# Patient Record
Sex: Female | Born: 1950 | Race: White | Hispanic: No | Marital: Married | Smoking: Current every day smoker
Health system: Southern US, Community
[De-identification: ages and names within clinical notes are randomized; demographics above are authoritative.]

## PROBLEM LIST (undated history)

## (undated) DIAGNOSIS — F329 Major depressive disorder, single episode, unspecified: Secondary | ICD-10-CM

## (undated) DIAGNOSIS — J309 Allergic rhinitis, unspecified: Secondary | ICD-10-CM

## (undated) DIAGNOSIS — C801 Malignant (primary) neoplasm, unspecified: Secondary | ICD-10-CM

## (undated) DIAGNOSIS — R310 Gross hematuria: Secondary | ICD-10-CM

## (undated) DIAGNOSIS — G35 Multiple sclerosis: Secondary | ICD-10-CM

## (undated) DIAGNOSIS — I700 Atherosclerosis of aorta: Secondary | ICD-10-CM

## (undated) DIAGNOSIS — K589 Irritable bowel syndrome without diarrhea: Secondary | ICD-10-CM

## (undated) DIAGNOSIS — D696 Thrombocytopenia, unspecified: Secondary | ICD-10-CM

## (undated) DIAGNOSIS — D692 Other nonthrombocytopenic purpura: Secondary | ICD-10-CM

## (undated) DIAGNOSIS — F419 Anxiety disorder, unspecified: Secondary | ICD-10-CM

## (undated) DIAGNOSIS — K219 Gastro-esophageal reflux disease without esophagitis: Secondary | ICD-10-CM

## (undated) DIAGNOSIS — Z87442 Personal history of urinary calculi: Secondary | ICD-10-CM

## (undated) DIAGNOSIS — D039 Melanoma in situ, unspecified: Secondary | ICD-10-CM

## (undated) DIAGNOSIS — D6862 Lupus anticoagulant syndrome: Secondary | ICD-10-CM

## (undated) DIAGNOSIS — I2510 Atherosclerotic heart disease of native coronary artery without angina pectoris: Secondary | ICD-10-CM

## (undated) DIAGNOSIS — E538 Deficiency of other specified B group vitamins: Secondary | ICD-10-CM

## (undated) DIAGNOSIS — M48061 Spinal stenosis, lumbar region without neurogenic claudication: Secondary | ICD-10-CM

## (undated) DIAGNOSIS — R760 Raised antibody titer: Secondary | ICD-10-CM

## (undated) DIAGNOSIS — M1711 Unilateral primary osteoarthritis, right knee: Secondary | ICD-10-CM

## (undated) DIAGNOSIS — I10 Essential (primary) hypertension: Secondary | ICD-10-CM

## (undated) DIAGNOSIS — M519 Unspecified thoracic, thoracolumbar and lumbosacral intervertebral disc disorder: Secondary | ICD-10-CM

## (undated) DIAGNOSIS — I639 Cerebral infarction, unspecified: Secondary | ICD-10-CM

## (undated) DIAGNOSIS — M5090 Cervical disc disorder, unspecified, unspecified cervical region: Secondary | ICD-10-CM

## (undated) DIAGNOSIS — F32A Depression, unspecified: Secondary | ICD-10-CM

## (undated) DIAGNOSIS — K257 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: Secondary | ICD-10-CM

## (undated) DIAGNOSIS — J410 Simple chronic bronchitis: Secondary | ICD-10-CM

## (undated) DIAGNOSIS — R519 Headache, unspecified: Secondary | ICD-10-CM

## (undated) DIAGNOSIS — J449 Chronic obstructive pulmonary disease, unspecified: Secondary | ICD-10-CM

## (undated) DIAGNOSIS — E559 Vitamin D deficiency, unspecified: Secondary | ICD-10-CM

## (undated) DIAGNOSIS — M1990 Unspecified osteoarthritis, unspecified site: Secondary | ICD-10-CM

## (undated) HISTORY — DX: Anxiety disorder, unspecified: F41.9

## (undated) HISTORY — DX: Cerebral infarction, unspecified: I63.9

## (undated) HISTORY — PX: FRACTURE SURGERY: SHX138

## (undated) HISTORY — PX: BREAST SURGERY: SHX581

## (undated) HISTORY — PX: TONSILLECTOMY: SUR1361

## (undated) HISTORY — PX: OTHER SURGICAL HISTORY: SHX169

## (undated) HISTORY — DX: Malignant (primary) neoplasm, unspecified: C80.1

## (undated) HISTORY — DX: Essential (primary) hypertension: I10

## (undated) HISTORY — PX: ABDOMINAL HYSTERECTOMY: SHX81

---

## 1988-04-07 HISTORY — PX: BREAST EXCISIONAL BIOPSY: SUR124

## 1988-04-07 HISTORY — PX: POLYPECTOMY: SHX149

## 1995-04-08 HISTORY — PX: BACK SURGERY: SHX140

## 1997-11-21 ENCOUNTER — Inpatient Hospital Stay (HOSPITAL_COMMUNITY): Admission: RE | Admit: 1997-11-21 | Discharge: 1997-11-22 | Payer: Self-pay | Admitting: Neurosurgery

## 2001-07-29 ENCOUNTER — Encounter: Payer: Self-pay | Admitting: Neurosurgery

## 2001-07-29 ENCOUNTER — Ambulatory Visit (HOSPITAL_COMMUNITY): Admission: RE | Admit: 2001-07-29 | Discharge: 2001-07-29 | Payer: Self-pay | Admitting: Neurosurgery

## 2001-09-03 ENCOUNTER — Ambulatory Visit (HOSPITAL_COMMUNITY): Admission: RE | Admit: 2001-09-03 | Discharge: 2001-09-03 | Payer: Self-pay | Admitting: Neurology

## 2001-09-03 ENCOUNTER — Encounter: Payer: Self-pay | Admitting: Neurology

## 2004-03-18 ENCOUNTER — Ambulatory Visit: Payer: Self-pay | Admitting: Unknown Physician Specialty

## 2004-07-02 ENCOUNTER — Ambulatory Visit: Payer: Self-pay | Admitting: Unknown Physician Specialty

## 2005-05-21 ENCOUNTER — Ambulatory Visit: Payer: Self-pay | Admitting: Unknown Physician Specialty

## 2005-07-22 ENCOUNTER — Ambulatory Visit: Payer: Self-pay | Admitting: Unknown Physician Specialty

## 2005-10-16 ENCOUNTER — Ambulatory Visit: Payer: Self-pay | Admitting: Gastroenterology

## 2006-07-29 ENCOUNTER — Ambulatory Visit: Payer: Self-pay | Admitting: Unknown Physician Specialty

## 2007-08-25 ENCOUNTER — Ambulatory Visit: Payer: Self-pay | Admitting: Unknown Physician Specialty

## 2008-08-25 ENCOUNTER — Ambulatory Visit: Payer: Self-pay | Admitting: Unknown Physician Specialty

## 2009-07-19 ENCOUNTER — Ambulatory Visit: Payer: Self-pay | Admitting: Unknown Physician Specialty

## 2009-07-31 ENCOUNTER — Ambulatory Visit: Payer: Self-pay | Admitting: Unknown Physician Specialty

## 2009-08-07 ENCOUNTER — Ambulatory Visit: Payer: Self-pay | Admitting: Unknown Physician Specialty

## 2009-10-11 ENCOUNTER — Ambulatory Visit: Payer: Self-pay | Admitting: Unknown Physician Specialty

## 2010-12-10 ENCOUNTER — Ambulatory Visit: Payer: Self-pay | Admitting: Unknown Physician Specialty

## 2011-05-07 ENCOUNTER — Ambulatory Visit: Payer: Self-pay | Admitting: Gastroenterology

## 2011-12-11 ENCOUNTER — Ambulatory Visit: Payer: Self-pay | Admitting: Internal Medicine

## 2012-03-26 ENCOUNTER — Ambulatory Visit: Payer: Self-pay | Admitting: Oncology

## 2012-04-12 ENCOUNTER — Ambulatory Visit: Payer: Self-pay | Admitting: Internal Medicine

## 2012-04-12 LAB — CBC CANCER CENTER
Basophil #: 0 x10 3/mm (ref 0.0–0.1)
Basophil %: 0.7 %
Eosinophil #: 0.1 x10 3/mm (ref 0.0–0.7)
Eosinophil %: 1.9 %
HCT: 36.6 % (ref 35.0–47.0)
HGB: 12.3 g/dL (ref 12.0–16.0)
Lymphocyte #: 1.1 x10 3/mm (ref 1.0–3.6)
Lymphocyte %: 19.8 %
MCH: 31.1 pg (ref 26.0–34.0)
MCHC: 33.6 g/dL (ref 32.0–36.0)
MCV: 92 fL (ref 80–100)
Monocyte #: 0.3 x10 3/mm (ref 0.2–0.9)
Monocyte %: 5.7 %
Neutrophil #: 3.9 x10 3/mm (ref 1.4–6.5)
Neutrophil %: 71.9 %
Platelet: 131 x10 3/mm — ABNORMAL LOW (ref 150–440)
RBC: 3.96 10*6/uL (ref 3.80–5.20)
RDW: 16.1 % — ABNORMAL HIGH (ref 11.5–14.5)
WBC: 5.5 x10 3/mm (ref 3.6–11.0)

## 2012-04-12 LAB — IRON AND TIBC
Iron Bind.Cap.(Total): 373 ug/dL (ref 250–450)
Iron Saturation: 20 %
Iron: 75 ug/dL (ref 50–170)
Unbound Iron-Bind.Cap.: 298 ug/dL

## 2012-04-12 LAB — LACTATE DEHYDROGENASE: LDH: 234 U/L (ref 81–246)

## 2012-04-12 LAB — FOLATE: Folic Acid: 35.8 ng/mL (ref 3.1–100.0)

## 2012-04-12 LAB — FERRITIN: Ferritin (ARMC): 72 ng/mL (ref 8–388)

## 2012-05-08 ENCOUNTER — Ambulatory Visit: Payer: Self-pay | Admitting: Internal Medicine

## 2012-06-30 ENCOUNTER — Encounter: Payer: Self-pay | Admitting: Internal Medicine

## 2012-07-06 ENCOUNTER — Encounter: Payer: Self-pay | Admitting: Internal Medicine

## 2012-07-09 ENCOUNTER — Ambulatory Visit: Payer: Self-pay | Admitting: Oncology

## 2012-07-29 ENCOUNTER — Ambulatory Visit (HOSPITAL_COMMUNITY): Admission: RE | Admit: 2012-07-29 | Payer: Medicare Other | Source: Home / Self Care | Admitting: Psychiatry

## 2012-08-05 ENCOUNTER — Encounter: Payer: Self-pay | Admitting: Internal Medicine

## 2012-08-25 ENCOUNTER — Encounter: Payer: Self-pay | Admitting: Orthopedic Surgery

## 2012-12-13 ENCOUNTER — Ambulatory Visit: Payer: Self-pay | Admitting: Internal Medicine

## 2013-10-03 ENCOUNTER — Observation Stay: Payer: Self-pay | Admitting: Internal Medicine

## 2013-10-03 ENCOUNTER — Ambulatory Visit: Payer: Self-pay | Admitting: Neurology

## 2013-10-03 LAB — URINALYSIS, COMPLETE
BLOOD: NEGATIVE
Bilirubin,UR: NEGATIVE
Glucose,UR: NEGATIVE mg/dL (ref 0–75)
Ketone: NEGATIVE
LEUKOCYTE ESTERASE: NEGATIVE
Nitrite: NEGATIVE
Ph: 6 (ref 4.5–8.0)
Protein: NEGATIVE
RBC,UR: 1 /HPF (ref 0–5)
SPECIFIC GRAVITY: 1.006 (ref 1.003–1.030)
Squamous Epithelial: 2
WBC UR: 2 /HPF (ref 0–5)

## 2013-10-03 LAB — BASIC METABOLIC PANEL
Anion Gap: 3 — ABNORMAL LOW (ref 7–16)
BUN: 22 mg/dL — ABNORMAL HIGH (ref 7–18)
Calcium, Total: 8.9 mg/dL (ref 8.5–10.1)
Chloride: 107 mmol/L (ref 98–107)
Co2: 27 mmol/L (ref 21–32)
Creatinine: 0.83 mg/dL (ref 0.60–1.30)
EGFR (African American): >60
EGFR (Non-African Amer.): >60
Glucose: 83 mg/dL (ref 65–99)
Osmolality: 276 (ref 275–301)
Potassium: 3.8 mmol/L (ref 3.5–5.1)
Sodium: 137 mmol/L (ref 136–145)

## 2013-10-03 LAB — CBC
HCT: 36.3 % (ref 35.0–47.0)
HGB: 12.1 g/dL (ref 12.0–16.0)
MCH: 30.1 pg (ref 26.0–34.0)
MCHC: 33.4 g/dL (ref 32.0–36.0)
MCV: 90 fL (ref 80–100)
Platelet: 131 10*3/uL — ABNORMAL LOW (ref 150–440)
RBC: 4.03 10*6/uL (ref 3.80–5.20)
RDW: 16.6 % — ABNORMAL HIGH (ref 11.5–14.5)
WBC: 4.8 10*3/uL (ref 3.6–11.0)

## 2013-10-03 LAB — CK TOTAL AND CKMB (NOT AT ARMC)
CK, TOTAL: 49 U/L
CK-MB: 1.1 ng/mL (ref 0.5–3.6)

## 2013-10-03 LAB — TROPONIN I: Troponin-I: <0.02 ng/mL

## 2013-10-04 LAB — LIPID PANEL
Cholesterol: 163 mg/dL (ref 0–200)
HDL Cholesterol: 35 mg/dL — ABNORMAL LOW (ref 40–60)
LDL CHOLESTEROL, CALC: 78 mg/dL (ref 0–100)
TRIGLYCERIDES: 248 mg/dL — AB (ref 0–200)
VLDL Cholesterol, Calc: 50 mg/dL — ABNORMAL HIGH (ref 5–40)

## 2013-10-04 LAB — BASIC METABOLIC PANEL
Anion Gap: 5 — ABNORMAL LOW (ref 7–16)
BUN: 19 mg/dL — ABNORMAL HIGH (ref 7–18)
CO2: 26 mmol/L (ref 21–32)
Calcium, Total: 9 mg/dL (ref 8.5–10.1)
Chloride: 109 mmol/L — ABNORMAL HIGH (ref 98–107)
Creatinine: 0.77 mg/dL (ref 0.60–1.30)
EGFR (Non-African Amer.): >60
GLUCOSE: 77 mg/dL (ref 65–99)
Osmolality: 280 (ref 275–301)
Potassium: 3.7 mmol/L (ref 3.5–5.1)
Sodium: 140 mmol/L (ref 136–145)

## 2013-10-04 LAB — MAGNESIUM: Magnesium: 2.1 mg/dL

## 2013-12-14 ENCOUNTER — Ambulatory Visit: Payer: Self-pay | Admitting: Internal Medicine

## 2014-07-29 NOTE — Discharge Summary (Signed)
PATIENT NAME:  Angela Suarez, Angela Suarez MR#:  [id number] DATE OF BIRTH:  1950-06-10  DATE OF ADMISSION:  10/03/2013 DATE OF DISCHARGE:  10/04/2013  FINAL DIAGNOSES: 1.  Double vision, blurred vision, ataxia, possibly secondary to vertigo.  2.  Hypertension.  3.  History of cerebrovascular accident.  4.  History of multiple sclerosis.   HISTORY AND PHYSICAL: Please see dictated admission history and physical.   HOSPITAL COURSE: The patient was admitted with onset of blurred vision and ataxia, with some vertigo symptoms. Worse when she was lying down and turning her head. She also has had significant social stressors and she thought this might have contributed as well. Her blood pressure was noted to be elevated, however, when she initially came in with her symptoms her blood pressure was normal, so blood pressure did not appear to be related to it. Neurology saw the patient who felt this was likely related to stress events versus vertigo. Her symptoms resolved completely within 3 hours, they did not recommend further work-up. She is ambulating in the room on the morning of discharge and wants to go home. At this point, she will be discharged home in stable condition with physical activity to be up as tolerated. She will follow up in our office in 1 to 2 weeks. She will follow a low sodium diet.   DISCHARGE MEDICATIONS: 1.  Clonazepam 1 mg tablets, 3 p.o. at bedtime, 0.5 mg p.o. at noon.  2.  Lyrica 100 mg p.o. daily.  3.  Sertraline 100 mg p.o. daily.  4.  Lipitor 10 mg p.o. at bedtime.  5.  Rebif subcutaneous 3 times a week.  6.  Amlodipine 2.5 mg p.o. daily.  7.  Aspirin 81 mg p.o. daily.  8.  Meclizine 25 mg p.o. q. 4 hours as needed for dizziness or vertigo; potential risk for sedation was discussed.   ____________________________ Adin Hector, MD bjk:sb D: 10/04/2013 07:45:06 ET T: 10/04/2013 07:56:08 ET JOB#: [id number]  cc: Tama High III, MD, <Dictator> Ramonita Lab MD ELECTRONICALLY  SIGNED 10/10/2013 8:05

## 2014-07-29 NOTE — H&P (Signed)
PATIENT NAME:  Angela Suarez, Angela Suarez MR#:  [id number] DATE OF BIRTH:  03-06-1951  DATE OF ADMISSION:  10/03/2013  PRIMARY CARE PHYSICIAN: Dr. Gorden Harms.   REFERRING PHYSICIAN: Dr. Reita Cliche.   CHIEF COMPLAINT: Double vision, blurred vision, and ataxia today.   HISTORY OF PRESENT ILLNESS: A 64 year old Caucasian female with a history of multiple sclerosis for 12 years, CVA last year, presents in the ED with blurred vision and double vision today. The patient is alert, awake, oriented, in no acute distress. The patient said that she started to have double vision and blurred vision early this morning. In addition, the patient feels imbalance and weakness which are not her baseline. The patient's blood pressure was above 130 at home but increased to 170s in ED. The patient denies any numbness and tingling. No headache but has dizziness. The patient denies any dysphagia, slurred speech, or incontinence. Denies any other symptoms.   PAST MEDICAL HISTORY: Multiple sclerosis, CVA, and hypertension.   PAST SURGICAL HISTORY: Hysterectomy, spine fusion, breast biopsy, and throat polyp removal.   SOCIAL HISTORY: Smokes 15 cigarettes a day for 50 years. Denies any alcohol drinking or illicit drugs.   FAMILY HISTORY: No hypertension, diabetes, heart attack, or stroke.   ALLERGIES: CODEINE, FLAGYL, OXYCODONE, PREVACID.   HOME MEDICATIONS:  1. Zoloft 100 mg p.o. daily.  2. Rebif subcutaneous 1 dose 3 times a day.  3. Lyrica 100 mg p.o. daily. 4. Lipitor 10 mg p.o. at bedtime.  5. Klonopin 1 mg 3 tablets at bedtime p.o.  6. Klonopin 0.5 mg 1 tab once a day at noon. 7. Aspirin 81 mg p.o. daily. 8. Norvasc 2.5 mg p.o. daily.   REVIEW OF SYSTEMS:  CONSTITUTIONAL: The patient denies any fever or chills, but has dizziness and weakness. No headache.  EYES: Positive for double vision, blurred vision.  HEENT: No dysphagia, slurred speech. No epistaxis.  CARDIOVASCULAR: No chest pain, palpitation, orthopnea, or  nocturnal dyspnea. No leg edema.  PULMONARY: No cough, sputum, shortness of breath, or hemoptysis.  GASTROINTESTINAL: No abdominal pain, nausea, vomiting, diarrhea. No melena or bloody stool.  GENITOURINARY: No dysuria, hematuria, or incontinence.  SKIN: No rash or jaundice.  NEUROLOGIC: No syncope, loss of consciousness, or seizure.  ENDOCRINE: No polyuria, polydipsia, heat or cold intolerance.  HEMATOLOGY: No easy bruising or bleeding.   PHYSICAL EXAMINATION:  VITAL SIGNS: Temperature 97.9, blood pressure 171/99, pulse 82, oxygen saturation 94% on room air.  GENERAL: The patient is alert, awake, oriented, in no acute distress.  HEENT:  Pupils round, equally reactive to light and accommodation.  NECK: Supple. No JVD or carotid bruits. No lymphadenopathy. No thyromegaly.  EARS, NOSE, AND THROAT: Moist oral mucosa, clear oropharynx.  CARDIOVASCULAR: S1, S2 regular rate and rhythm. No murmurs or gallops.  PULMONARY: Bilateral air entry. No wheezing or rales. No use of accessory muscle to breathe.  ABDOMEN: Soft. No distention. No tenderness. No organomegaly. Bowel sounds present.  EXTREMITIES: No edema, clubbing or cyanosis. No calf tenderness. Bilateral pedal pulses present.  SKIN: No rash or jaundice.  NEUROLOGIC: A and O x 3. No focal deficit. Power 5/5. Sensory intact.   LABORATORY DATA: Urinalysis is negative. CAT scan of head is negative. No acute findings. CBC in normal range except platelets 131,000, glucose 83, BUN 22, creatinine 0.83. Electrolytes are normal. Troponin less than 0.02.    Her EKG showed normal sinus rhythm at a 70 BPM.   IMPRESSIONS:  1. Ataxia, need to rule out a cerebrovascular accident or a  multiple sclerosis flare.  2. Dehydration.  3. Hypertension.   PLAN OF TREATMENT:  1. The patient will be placed for observation. We will get a carotid duplex. Plan is going to get an MRI of brain, echocardiograph but according to Dr. Irish Elders, patient has no acute  cerebrovascular accident, does not need an MRI. 2. We will continue aspirin, statin and get a neuro check and a physical therapy consult. Hold rebif this time due to ataxia.  3. For dehydration, we will start normal saline IV and follow up BMP.  4. For hypertension, continue Norvasc and adjust dose depending on the patient's blood pressure. Discussed the patient's condition and plan of treatment with patient.   CODE STATUS: The patient is a full code.   Discussed with Dr. Irish Elders. He suggested no MRI, no steroid. Monitor the patient today and get a physical therapy evaluation, possible discharge home tomorrow.   TIME SPENT: About 68 minutes.    ____________________________ Demetrios Loll, MD qc:lt D: 10/03/2013 15:47:21 ET T: 10/03/2013 21:06:58 ET JOB#: [id number]  cc: Demetrios Loll, MD, <Dictator> Demetrios Loll MD ELECTRONICALLY SIGNED 10/04/2013 12:23

## 2014-07-29 NOTE — Consult Note (Signed)
PATIENT NAME:  Angela Suarez, CRANMER MR#:  [id number] DATE OF BIRTH:  1950-04-27  DATE OF CONSULTATION:  10/03/2013  REFERRING PHYSICIAN:   CONSULTING PHYSICIAN:  Leotis Pain, MD  REASON FOR CONSULTATION: Questionable stroke.   HISTORY OF PRESENT ILLNESS: This is a 64 year old pleasant female with past medical history of multiple sclerosis that was diagnosed in 2003 via imaging. At that time she had a complaint of right-sided numbness that included facial and arm numbness for about 6 months. Based on imaging, it was suspected this was multiple sclerosis. The patient was started on Rebif which she is currently on 3 times a week. No recent relapse in remissions, but in the past has 1 relapse in remission at which point she has required steroids. In March of 2015, the patient had left facial numbness and left hand numbness that was worked up and the patient was found to have right thalamic stroke at which point the patient was started on 81 mg. Today presented with dizziness, diplopia and generalized weakness. Symptoms have improved throughout the course of the stay. CAT scan of the head did not show any acute intracranial pathology.   PAST MEDICAL HISTORY: As described above of multiple sclerosis and a stroke in March of 2014  SOCIAL HISTORY: Positive smoking history. No EtOH. No drug use.   FAMILY HISTORY: Positive hypertension.   DIAGNOSTIC DATA: Imaging: CAT scan of the head did not show any acute pathology.   Laboratory workup just significant for slight elevation of BUN, suspicion for dehydration.   REVIEW OF SYSTEMS: No headache. No fever. No chills. No generalized weakness. No shortness of breath. No chest pain. No abdominal pain. No cough. No hemoptysis. No urinary incontinence. No weakness on one side of the body or the other that is new. No anxiety. No depression.   PHYSICAL EXAMINATION: VITAL SIGNS: Include a pulse of 82, blood pressure elevated at 171/99, and pulse ox 94% on room air.   NEUROLOGIC: The patient is alert, awake and oriented to date, time and location. Able to tell me how many quarters are in 1.50. Able to tell me days of the week backwards. Speech appears to be fluent. No dysarthria. No aphasia is present. Cranial nerve examination: Extraocular movements intact. Pupils reactive bilaterally. Facial sensation intact. Facial motor intact. Tongue is midline. Uvula elevates symmetrically. Shoulder shrug intact. Motor strength appears to be 5/5 in bilateral upper and lower extremities. No sensory deficits noted. Reflexes slightly hyperreflexive, likely from history of multiple sclerosis. Coordination: Finger-to-nose intact. Gait not assessed.   IMPRESSION: A 64 year old female presenting with vague symptoms of dizziness, diplopia and generalized weakness, which have improved, with elevated blood pressure on admission. Suspect symptoms could be related from elevated blood pressure.   PLAN: Would not do any further imaging. MRI of the brain was canceled. Continue the aspirin 81 mg daily. The patient is scheduled for MRI in November for which she follows up with [facility] neurology. Would observe for overnight hydration, physical therapy and likely discharge planning.   Thank you. It was a pleasure seeing this patient. This case was discussed with Dr. Bridgett Larsson at bedside.   ____________________________ Leotis Pain, MD yz:sb D: 10/03/2013 15:54:01 ET T: 10/03/2013 16:24:33 ET JOB#: [id number]  cc: Leotis Pain, MD, <Dictator> Leotis Pain MD ELECTRONICALLY SIGNED 10/18/2013 14:54

## 2014-08-14 ENCOUNTER — Other Ambulatory Visit: Payer: Self-pay | Admitting: Internal Medicine

## 2014-08-14 DIAGNOSIS — Z1231 Encounter for screening mammogram for malignant neoplasm of breast: Secondary | ICD-10-CM

## 2014-09-20 ENCOUNTER — Inpatient Hospital Stay: Payer: Medicare Other | Attending: Family Medicine | Admitting: Family Medicine

## 2014-09-20 ENCOUNTER — Ambulatory Visit
Admission: RE | Admit: 2014-09-20 | Discharge: 2014-09-20 | Disposition: A | Discharge status: Home / Self Care | Payer: Medicare Other | Source: Ambulatory Visit | Attending: Family Medicine | Admitting: Family Medicine

## 2014-09-20 ENCOUNTER — Other Ambulatory Visit: Payer: Self-pay | Admitting: Family Medicine

## 2014-09-20 DIAGNOSIS — Z720 Tobacco use: Secondary | ICD-10-CM

## 2014-09-20 DIAGNOSIS — F17210 Nicotine dependence, cigarettes, uncomplicated: Secondary | ICD-10-CM | POA: Diagnosis not present

## 2014-09-20 DIAGNOSIS — Z122 Encounter for screening for malignant neoplasm of respiratory organs: Secondary | ICD-10-CM

## 2014-09-20 NOTE — Progress Notes (Signed)
In accordance with CMS guidelines, patient has meet eligibility criteria including age, absence of signs or symptoms of lung cancer, the specific calculation of cigarette smoking pack-years was 50 years and is a current smoker.   A shared decision-making session was conducted prior to the performance of CT scan. This includes one or more decision aids, includes benefits and harms of screening, follow-up diagnostic testing, over-diagnosis, false positive rate, and total radiation exposure.  Counseling on the importance of adherence to annual lung cancer LDCT screening, impact of co-morbidities, and ability or willingness to undergo diagnosis and treatment is imperative for compliance of the program.  Counseling on the importance of continued smoking cessation for former smokers; the importance of smoking cessation for current smokers and information about tobacco cessation interventions have been given to patient including the [place] [facility], 1800 quit [place], as well as Cancer Center specific smoking cessation programs.  Written order for lung cancer screening with LDCT has been given to the patient and any and all questions have been answered to the best of my abilities.   Yearly follow up will be scheduled by Shawn Perkins, Thoracic Navigator.   

## 2014-09-20 NOTE — Progress Notes (Deleted)
In accordance with CMS guidelines, patient has meet eligibility criteria including age, absence of signs or symptoms of lung cancer, the specific calculation of cigarette smoking pack-years was 40 years and she is a former smoker, having quit 5 years ago.   A shared decision-making session was conducted prior to the performance of CT scan. This includes one or more decision aids, includes benefits and harms of screening, follow-up diagnostic testing, over-diagnosis, false positive rate, and total radiation exposure.  Counseling on the importance of adherence to annual lung cancer LDCT screening, impact of co-morbidities, and ability or willingness to undergo diagnosis and treatment is imperative for compliance of the program.  Counseling on the importance of continued smoking cessation for former smokers; the importance of smoking cessation for current smokers and information about tobacco cessation interventions have been given to patient including the [place] [facility], 1800 quit [place], as well as [place] specific smoking cessation programs.  Written order for lung cancer screening with LDCT has been given to the patient and any and all questions have been answered to the best of my abilities.   Yearly follow up will be scheduled by Burgess Estelle, Thoracic Navigator.

## 2014-09-22 ENCOUNTER — Telehealth: Payer: Self-pay | Admitting: *Deleted

## 2014-09-22 NOTE — Telephone Encounter (Signed)
Notified patient of LDCT lung cancer screening results of Lung Rads  2  finding with recommendation for 12 month follow up imaging. Also notified of incidental finding of mild emphysema and coronary artery atherosclerosis. Encouraged to consider the smoking cessation programs that we have available.

## 2014-12-18 ENCOUNTER — Other Ambulatory Visit: Payer: Self-pay | Admitting: Internal Medicine

## 2014-12-18 ENCOUNTER — Ambulatory Visit
Admission: RE | Admit: 2014-12-18 | Discharge: 2014-12-18 | Disposition: A | Discharge status: Home / Self Care | Payer: Medicare Other | Source: Ambulatory Visit | Attending: Internal Medicine | Admitting: Internal Medicine

## 2014-12-18 DIAGNOSIS — Z1231 Encounter for screening mammogram for malignant neoplasm of breast: Secondary | ICD-10-CM | POA: Diagnosis present

## 2015-10-12 ENCOUNTER — Telehealth: Payer: Self-pay | Admitting: *Deleted

## 2015-10-12 NOTE — Telephone Encounter (Signed)
Left voicemail for patient notifyng them that it is time to schedule annual low dose lung cancer screening CT scan. Instructed patient to call back to verify information prior to the scan being scheduled. In past patient had refused to have follow up scan but PCP office contacted me with request to contact patient again.

## 2015-10-16 ENCOUNTER — Telehealth: Payer: Self-pay | Admitting: *Deleted

## 2015-10-16 NOTE — Telephone Encounter (Signed)
Notified patient that annual lung cancer screening low dose CT scan is due. Confirmed that patient is within the age range of 55-77, and asymptomatic, (no signs or symptoms of lung cancer). Patient denies illness that would prevent curative treatment for lung cancer if found. The patient is a current smoker, with a 51 pack year history. The shared decision making visit was done 09/20/14. Patient is agreeable for CT scan being scheduled.

## 2015-10-26 ENCOUNTER — Other Ambulatory Visit: Payer: Self-pay | Admitting: Internal Medicine

## 2015-10-26 DIAGNOSIS — Z1231 Encounter for screening mammogram for malignant neoplasm of breast: Secondary | ICD-10-CM

## 2015-10-31 ENCOUNTER — Other Ambulatory Visit: Payer: Self-pay | Admitting: *Deleted

## 2015-10-31 DIAGNOSIS — Z87891 Personal history of nicotine dependence: Secondary | ICD-10-CM

## 2015-11-07 ENCOUNTER — Ambulatory Visit
Admission: RE | Admit: 2015-11-07 | Discharge: 2015-11-07 | Disposition: A | Discharge status: Home / Self Care | Payer: Medicare HMO | Source: Ambulatory Visit | Attending: Oncology | Admitting: Oncology

## 2015-11-07 DIAGNOSIS — Z87891 Personal history of nicotine dependence: Secondary | ICD-10-CM | POA: Diagnosis present

## 2015-11-07 DIAGNOSIS — J439 Emphysema, unspecified: Secondary | ICD-10-CM | POA: Insufficient documentation

## 2015-11-07 DIAGNOSIS — I2510 Atherosclerotic heart disease of native coronary artery without angina pectoris: Secondary | ICD-10-CM | POA: Insufficient documentation

## 2015-11-09 ENCOUNTER — Telehealth: Payer: Self-pay | Admitting: *Deleted

## 2015-11-09 NOTE — Telephone Encounter (Signed)
Notified patient of LDCT lung cancer screening results with recommendation for 12 month follow up imaging. Also notified of incidental finding noted below. Patient verbalizes understanding.   IMPRESSION: Lung-RADS Category 2, benign appearance or behavior. Continue annual screening with low-dose chest CT without contrast in 12 months.  Emphysema.  Coronary artery atherosclerosis

## 2015-12-03 ENCOUNTER — Ambulatory Visit: Payer: Medicare HMO | Attending: Psychiatry | Admitting: Physical Therapy

## 2015-12-03 ENCOUNTER — Encounter: Payer: Self-pay | Admitting: Physical Therapy

## 2015-12-03 DIAGNOSIS — M6281 Muscle weakness (generalized): Secondary | ICD-10-CM | POA: Diagnosis present

## 2015-12-03 DIAGNOSIS — R262 Difficulty in walking, not elsewhere classified: Secondary | ICD-10-CM | POA: Insufficient documentation

## 2015-12-03 NOTE — Therapy (Signed)
[facility] [address] [place], [address] Phone: [phone]   Fax:  [phone]  Physical Therapy Evaluation  Patient Details  Name: Angela Suarez MRN: [id number] Date of Birth: 25-Apr-1950 Referring Provider: Janit Pagan MD  Encounter Date: 12/03/2015      PT End of Session - 12/03/15 1631    Visit Number 1   Number of Visits 12   Date for PT Re-Evaluation 01/14/16   Authorization Type 1   Authorization Time Period 10 (G code)   PT Start Time 1522   PT Stop Time 1638   PT Time Calculation (min) 76 min   Activity Tolerance Patient tolerated treatment well   Behavior During Therapy [facility] for tasks assessed/performed      Past Medical History:  Diagnosis Date  . Anxiety   . Cancer ([place])    skin  . Hypertension   . Stroke [facility])    06/2012    Past Surgical History:  Procedure Laterality Date  . BREAST BIOPSY Right 1990   x 2, neg    There were no vitals filed for this visit.       Subjective Assessment - 12/03/15 1558    Subjective Patient reports she is having more difficulty with gait, balance, that has been worsening over the past year due to weakness and progressive disease, MS. Both hips hurt and feels weakness limits her abiltiy to walk without losing balance.    Pertinent History Diagnosed with MS 14 years ago and has had injections for treatment for exacerbating remitting type disease until July 2017. She has tried YOGA with good results in the past. 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    Limitations Walking;Standing;House hold activities;Other (comment)  community ambulation long distances   How long can you sit comfortably? as long as she likes with choice of seating   How long can you stand comfortably? 15 min.   How long can you walk comfortably? with walker: 30 min.   Patient Stated  Goals To be able to exercise with yoga and positions to avoid,    Currently in Pain? Yes   Pain Score 3   pain ranges from 1/10 up to 8/10   Pain Location Hip   Pain Orientation Right;Left   Pain Descriptors / Indicators Aching   Pain Type Chronic pain   Pain Onset More than a month ago   Pain Frequency Constant   Aggravating Factors  too much walking or sitting or standing   Pain Relieving Factors rest   Effect of Pain on Daily Activities difficulty with walking and standing activties            [facility] PT Assessment - 12/03/15 1550      Assessment   Medical Diagnosis Multiple Sclerosis (SMS-HCC) (G35)   Referring Provider Janit Pagan MD   Onset Date/Surgical Date 11/05/13   Hand Dominance Right   Next MD Visit unknown   Prior Therapy none     Precautions   Precautions Fall   Precaution Comments decreased balance, confidence     Balance Screen   Has the patient fallen in the past 6 months Yes   How many times? 4  1 in the garage, 3 in her home: no apparent reason   Has the patient had a decrease in activity level because of a fear of  falling?  No   Is the patient reluctant to leave their home because of a fear of falling?  No     Home Environment   Living Environment Private residence   Living Arrangements Spouse/significant other   Type of [place] to enter   Entrance Stairs-Number of Steps 3   Entrance Stairs-Rails Can reach both   [place] One level   [place] bars - toilet;Cane - single point;Toilet riser  rollator     Prior Function   Level of Independence Independent   Vocation Retired   Leisure read, shop, visit with friends, socialize, farm living     Cognition   Overall Cognitive Status Within Functional Limits for tasks assessed     Objective: Gait: steppage gait with left LE, decreased weight shift to left LE Strength: decreased left hip abduction, ER 3+/5 grossly assessed, as compared to right LE, both  LE ankles DF decreased as per patient report Sensation: per patient report: paresthesias both LE's, left hand and numbness both feet  Outcome measures: LEFS 36/80, TUG 11.8 seconds (WFL for community ambulation), 5x sit<>stand 14.8 seconds (in need of intervention)  Treatment: Therapeutic exercise: patient performed exercises with guidance, verbal and tactile cues and demonstration of PT: Sitting:  Hip adduction with glute sets with ball x 10 reps 3 second holds Hip abduction in sit with resistive band x 15 reps Side lying clamshells x 10 reps each LE Supine lying: Bridging with ball between knees x 10 reps with TrA contraction and guidance for correct technique (note: shaking, unstable during exercise) Sit to stand with blue airex pad under right foot to encourage left LE weight bearing x 5 reps  Patient response to treatment:  Improved ability to walk with decreased steppage gait pattern, improved weight shift to left with exercises to encourage weight shifting. Good understanding of exercises with assistance and moderate verbal cuing and demonstration. Technique improved with practice/repetition for bridging.        PT Education - 12/03/15 1645    Education provided Yes   Education Details HEP; siting hip adduction with ball and glute sets, hip abduction with resistive band, side lying clam   Person(s) Educated Patient   Methods Explanation;Demonstration;Handout;Verbal cues   Comprehension Verbalized understanding;Verbal cues required;Returned demonstration            PT Long Term Goals - 12/03/15 1644      PT LONG TERM GOAL #1   Title Patient will demonstrate improved balance, function with daily tasks with LEFS score of 50/80 by 01/14/2016   Baseline LEFS 36/80   Status New     PT LONG TERM GOAL #2   Title Patient will demonstrate decreased fall risk allowing improved balance, walking in home and community with TUG of 10 seconds or less by 01/14/2016   Baseline TUG  11.8 seconds   Status New     PT LONG TERM GOAL #3   Title patient will demonstrate imrpoved strength with 5x sit to stand in 10 seconds or less by 01/14/2016   Baseline 5x sit to stand 14.8 seconds   Status New     PT LONG TERM GOAL #4   Title patient will be independent with home program without cuing for progressive exercises for balance and strengthening by 01/14/2016 to allow self management following discharge from physical therapy   Baseline requires assistance, guidance for all exercises; limited knowledge of appropriate exercises for strengthening   Status New  Plan - 12/03/15 1643    Clinical Impression Statement Patient is a 65 year old female who presents with difficulty walking with decreased confidence for balance when walking. She has weakness in LE's left>right LE expecially in hips. She feels unstable with walking on any surface and has limited knowledge of appopriate exercises to perform to improve strength to decrease fall risk and improve function.    Rehab Potential Fair   Clinical Impairments Affecting Rehab Potential (+) motivated, family support (-) age, chronic condition MS, progression of weakness   PT Frequency 2x / week   PT Duration 6 weeks   PT Treatment/Interventions Therapeutic exercise;Balance training;Neuromuscular re-education;Patient/family education   PT Next Visit Plan balance system, progressive exercise, strengthening   PT Home Exercise Plan hip adduction with ball, hip abduction with resistive band in sitting,    Consulted and Agree with Plan of Care Patient      Patient will benefit from skilled therapeutic intervention in order to improve the following deficits and impairments:  Decreased strength, Decreased balance, Pain, Decreased activity tolerance, Decreased endurance, Difficulty walking  Visit Diagnosis: Muscle weakness (generalized) - Plan: PT plan of care cert/re-cert  Difficulty in walking, not elsewhere  classified - Plan: PT plan of care cert/re-cert      G-Codes - [id number]    Functional Assessment Tool Used LEFS, 5x sit to stand, TUG, strength deficits, pain, clinical judgment   Functional Limitation Mobility: Walking and moving around   Mobility: Walking and Moving Around Current Status [id number]) At least 40 percent but less than 60 percent impaired, limited or restricted   Mobility: Walking and Moving Around Goal Status [id number]) At least 20 percent but less than 40 percent impaired, limited or restricted       Problem List There are no active problems to display for this patient.   Jomarie Longs PT 12/04/2015, 4:44 PM  [facility] [address] [place], [address] Phone: [phone]   Fax:  [phone]  Name: Angela Suarez MRN: [id number] Date of Birth: 1951/03/10

## 2015-12-14 ENCOUNTER — Ambulatory Visit: Payer: Medicare HMO | Admitting: Physical Therapy

## 2015-12-18 ENCOUNTER — Encounter: Payer: Medicare HMO | Admitting: Physical Therapy

## 2015-12-19 ENCOUNTER — Other Ambulatory Visit: Payer: Self-pay | Admitting: Internal Medicine

## 2015-12-19 ENCOUNTER — Ambulatory Visit
Admission: RE | Admit: 2015-12-19 | Discharge: 2015-12-19 | Disposition: A | Discharge status: Home / Self Care | Payer: Medicare HMO | Source: Ambulatory Visit | Attending: Internal Medicine | Admitting: Internal Medicine

## 2015-12-19 DIAGNOSIS — Z1231 Encounter for screening mammogram for malignant neoplasm of breast: Secondary | ICD-10-CM

## 2015-12-20 ENCOUNTER — Encounter: Payer: Medicare HMO | Admitting: Physical Therapy

## 2015-12-21 ENCOUNTER — Encounter: Payer: Self-pay | Admitting: Emergency Medicine

## 2015-12-21 ENCOUNTER — Emergency Department
Admission: EM | Admit: 2015-12-21 | Discharge: 2015-12-22 | Disposition: A | Discharge status: Home / Self Care | Payer: Medicare HMO | Attending: Emergency Medicine | Admitting: Emergency Medicine

## 2015-12-21 ENCOUNTER — Emergency Department: Payer: Medicare HMO

## 2015-12-21 DIAGNOSIS — Z85828 Personal history of other malignant neoplasm of skin: Secondary | ICD-10-CM | POA: Insufficient documentation

## 2015-12-21 DIAGNOSIS — I10 Essential (primary) hypertension: Secondary | ICD-10-CM | POA: Diagnosis not present

## 2015-12-21 DIAGNOSIS — Z79899 Other long term (current) drug therapy: Secondary | ICD-10-CM | POA: Diagnosis not present

## 2015-12-21 DIAGNOSIS — R072 Precordial pain: Secondary | ICD-10-CM | POA: Diagnosis present

## 2015-12-21 DIAGNOSIS — F17200 Nicotine dependence, unspecified, uncomplicated: Secondary | ICD-10-CM | POA: Diagnosis not present

## 2015-12-21 DIAGNOSIS — R079 Chest pain, unspecified: Secondary | ICD-10-CM

## 2015-12-21 DIAGNOSIS — R0789 Other chest pain: Secondary | ICD-10-CM | POA: Diagnosis not present

## 2015-12-21 DIAGNOSIS — Z7982 Long term (current) use of aspirin: Secondary | ICD-10-CM | POA: Insufficient documentation

## 2015-12-21 LAB — TROPONIN I

## 2015-12-21 LAB — CBC
HCT: 41.6 % (ref 35.0–47.0)
HEMOGLOBIN: 14 g/dL (ref 12.0–16.0)
MCH: 30.6 pg (ref 26.0–34.0)
MCHC: 33.8 g/dL (ref 32.0–36.0)
MCV: 90.7 fL (ref 80.0–100.0)
Platelets: 128 10*3/uL — ABNORMAL LOW (ref 150–440)
RBC: 4.58 MIL/uL (ref 3.80–5.20)
RDW: 16.8 % — ABNORMAL HIGH (ref 11.5–14.5)
WBC: 7.5 10*3/uL (ref 3.6–11.0)

## 2015-12-21 LAB — BASIC METABOLIC PANEL
ANION GAP: 8 (ref 5–15)
BUN: 30 mg/dL — ABNORMAL HIGH (ref 6–20)
CALCIUM: 9 mg/dL (ref 8.9–10.3)
CO2: 29 mmol/L (ref 22–32)
Chloride: 100 mmol/L — ABNORMAL LOW (ref 101–111)
Creatinine, Ser: 0.92 mg/dL (ref 0.44–1.00)
Glucose, Bld: 115 mg/dL — ABNORMAL HIGH (ref 65–99)
Potassium: 3 mmol/L — ABNORMAL LOW (ref 3.5–5.1)
Sodium: 137 mmol/L (ref 135–145)

## 2015-12-21 MED ORDER — MORPHINE SULFATE (PF) 4 MG/ML IV SOLN
4.0000 mg | Freq: Once | INTRAVENOUS | Status: AC
  Administered 2015-12-21: 4 mg via INTRAVENOUS
  Filled 2015-12-21: qty 1

## 2015-12-21 MED ORDER — IOPAMIDOL (ISOVUE-370) INJECTION 76%
75.0000 mL | Freq: Once | INTRAVENOUS | Status: AC | PRN
  Administered 2015-12-21: 75 mL via INTRAVENOUS

## 2015-12-21 MED ORDER — POTASSIUM CHLORIDE CRYS ER 20 MEQ PO TBCR
40.0000 meq | EXTENDED_RELEASE_TABLET | Freq: Once | ORAL | Status: AC
  Administered 2015-12-21: 40 meq via ORAL
  Filled 2015-12-21: qty 2

## 2015-12-21 MED ORDER — ASPIRIN 81 MG PO CHEW
324.0000 mg | CHEWABLE_TABLET | Freq: Once | ORAL | Status: AC
  Administered 2015-12-21: 324 mg via ORAL
  Filled 2015-12-21: qty 4

## 2015-12-21 MED ORDER — SODIUM CHLORIDE 0.9 % IV BOLUS (SEPSIS)
1000.0000 mL | Freq: Once | INTRAVENOUS | Status: AC
  Administered 2015-12-21: 1000 mL via INTRAVENOUS
  Filled 2015-12-21: qty 1000

## 2015-12-21 NOTE — ED Notes (Signed)
MD at bedside. 

## 2015-12-21 NOTE — Discharge Instructions (Addendum)

## 2015-12-21 NOTE — ED Triage Notes (Signed)
Pt presents with chest pain that radiates up into her neck and into her back, started last night. Pt had ems come to her home last night to check her out and all vss.  Pt states she called her PCP today and they could see her until Monday so she came here for eval.

## 2015-12-21 NOTE — ED Provider Notes (Signed)
[facility] Emergency Department Provider Note  ____________________________________________  Time seen: Approximately 9:32 PM  I have reviewed the triage vital signs and the nursing notes.   HISTORY  Chief Complaint Chest Pain   HPI Angela Suarez is a 65 y.o. female with history of multiple sclerosis, stroke, hypertension, and anxiety who presents for evaluation of chest pain. Patient reports the chest pain started suddenly yesterday 7 PM he has been constant, dull pain located substernally, radiating up her neck and to her upper back between her shoulder blades. The pain is pleuritic in nature. She reports that the pain is currently 8/10. She denies shortness of breath, fever, cough, abdominal pain, nausea, vomiting, dysuria, hematuria. She is a smoker. She denies personal or family history of ischemic heart disease, blood clots, recent travel or immobilization, exogenous hormones, hemoptysis, leg pain or swelling. Patient has had 1 stress test many years ago which per her was negative.  Past Medical History:  Diagnosis Date  . Anxiety   . Cancer ([place])    skin  . Hypertension   . Stroke [facility])    06/2012    There are no active problems to display for this patient.   Past Surgical History:  Procedure Laterality Date  . BREAST BIOPSY Right 1990   x 2, neg    Prior to Admission medications   Medication Sig Start Date End Date Taking? Authorizing Provider  aspirin 325 MG tablet Take 325 mg by mouth daily.    Historical Provider, MD  atorvastatin (LIPITOR) 10 MG tablet Take 10 mg by mouth daily.    Historical Provider, MD  clonazePAM (KLONOPIN) 1 MG tablet Take 1 mg by mouth 2 (two) times daily.    Historical Provider, MD  hydrochlorothiazide (HYDRODIURIL) 25 MG tablet Take 25 mg by mouth daily.    Historical Provider, MD  losartan (COZAAR) 25 MG tablet Take 25 mg by mouth daily.    Historical Provider, MD  pregabalin (LYRICA) 150 MG capsule Take 150  mg by mouth 2 (two) times daily.    Historical Provider, MD    Allergies Codeine; Flagyl [metronidazole]; Prevacid [lansoprazole]; and Vicodin [hydrocodone-acetaminophen]  Family History  Problem Relation Age of Onset  . Breast cancer Mother 21  . Cancer Mother   . Breast cancer Paternal Aunt 20  . Breast cancer Paternal Aunt 35  . Cancer Father     Social History Social History  Substance Use Topics  . Smoking status: Current Every Day Smoker  . Smokeless tobacco: Never Used  . Alcohol use Not on file    Review of Systems  Constitutional: Negative for fever. Eyes: Negative for visual changes. ENT: Negative for sore throat. Cardiovascular: + chest pain. Respiratory: Negative for shortness of breath. Gastrointestinal: Negative for abdominal pain, vomiting or diarrhea. Genitourinary: Negative for dysuria. Musculoskeletal: Negative for back pain. Skin: Negative for rash. Neurological: Negative for headaches, weakness or numbness.  ____________________________________________   PHYSICAL EXAM:  VITAL SIGNS: ED Triage Vitals  Enc Vitals Group     BP 12/21/15 1840 (!) 129/92     Pulse Rate 12/21/15 1840 91     Resp 12/21/15 1840 20     Temp 12/21/15 1840 98.4 F (36.9 C)     Temp Source 12/21/15 1840 Oral     SpO2 12/21/15 1840 95 %     Weight 12/21/15 1841 152 lb (68.9 kg)     Height 12/21/15 1841 5\' 4"  (1.626 m)     Head Circumference --  Peak Flow --      Pain Score 12/21/15 1841 9     Pain Loc --      Pain Edu? --      Excl. in [place]? --     Constitutional: Alert and oriented. Well appearing and in no apparent distress. HEENT:      Head: Normocephalic and atraumatic.         Eyes: Conjunctivae are normal. Sclera is non-icteric. EOMI. PERRL      Mouth/Throat: Mucous membranes are moist.       Neck: Supple with no signs of meningismus. Cardiovascular: Regular rate and rhythm. No murmurs, gallops, or rubs. 2+ symmetrical distal pulses are present in all  extremities. No JVD. Respiratory: Normal respiratory effort. Lungs are clear to auscultation bilaterally. No wheezes, crackles, or rhonchi.  Gastrointestinal: Soft, non tender, and non distended with positive bowel sounds. No rebound or guarding. Genitourinary: No CVA tenderness. Musculoskeletal: Nontender with normal range of motion in all extremities. No edema, cyanosis, or erythema of extremities. Neurologic: Normal speech and language. Face is symmetric. Moving all extremities. No gross focal neurologic deficits are appreciated. Skin: Skin is warm, dry and intact. No rash noted. Psychiatric: Mood and affect are normal. Speech and behavior are normal.  ____________________________________________   LABS (all labs ordered are listed, but only abnormal results are displayed)  Labs Reviewed  BASIC METABOLIC PANEL - Abnormal; Notable for the following:       Result Value   Potassium 3.0 (*)    Chloride 100 (*)    Glucose, Bld 115 (*)    BUN 30 (*)    All other components within normal limits  CBC - Abnormal; Notable for the following:    RDW 16.8 (*)    Platelets 128 (*)    All other components within normal limits  TROPONIN I  LIPASE, BLOOD  HEPATIC FUNCTION PANEL  TROPONIN I   ____________________________________________  EKG  ED ECG REPORT I, Rudene Re, the attending physician, personally viewed and interpreted this ECG.  Normal sinus rhythm, rate of 91, normal intervals, left axis deviation, no ST elevations or depressions. ____________________________________________  RADIOLOGY  CXR: Negative  CTA chest: PND ____________________________________________   PROCEDURES  Procedure(s) performed: None Procedures Critical Care performed:  None ____________________________________________   INITIAL IMPRESSION / ASSESSMENT AND PLAN / ED COURSE  65 y.o. female with history of multiple sclerosis, stroke, hypertension, and anxiety who presents for evaluation  of constant substernal chest pain since yesterday 7 PM. Pain is pleuritic with no other associated symptoms. On exam she is well appearing, no distress, her vital signs are within normal limits, physical exam is unremarkable. EKG is nonischemic. First troponin is negative. We'll watch patient on telemetry and gave her full dose of aspirin although history is not consistent with ACS and patient has a HEART score of 1. We'll get a CTA to rule out PE. We'll also check lipase and LFTs to rule out an abdominal pathology however patient has no tenderness to palpation on her abdomen. We'll treat her symptoms with IV fluids and IV morphine.  Clinical Course   _________________________ 10:51 PM on 12/21/2015 -----------------------------------------  CTA, 2nd troponin, LFT and lipase pending. Care transferred to Dr. Marcelene Butte.  Pertinent labs & imaging results that were available during my care of the patient were reviewed by me and considered in my medical decision making (see chart for details).    ____________________________________________   FINAL CLINICAL IMPRESSION(S) / ED DIAGNOSES  Final diagnoses:  Chest  pain, unspecified chest pain type      NEW MEDICATIONS STARTED DURING THIS VISIT:  New Prescriptions   No medications on file     Note:  This document was prepared using Dragon voice recognition software and may include unintentional dictation errors.    Rudene Re, MD 12/21/15 2252

## 2015-12-21 NOTE — ED Notes (Signed)
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  She says her [facility] records will tell her story, as she goes there a lot and they have treated her in the past.

## 2015-12-22 LAB — HEPATIC FUNCTION PANEL
ALBUMIN: 3.5 g/dL (ref 3.5–5.0)
ALT: 14 U/L (ref 14–54)
AST: 19 U/L (ref 15–41)
Alkaline Phosphatase: 53 U/L (ref 38–126)
BILIRUBIN TOTAL: 0.5 mg/dL (ref 0.3–1.2)
Bilirubin, Direct: <0.1 mg/dL — ABNORMAL LOW (ref 0.1–0.5)
Total Protein: 6.6 g/dL (ref 6.5–8.1)

## 2015-12-22 LAB — TROPONIN I

## 2015-12-22 LAB — LIPASE, BLOOD: LIPASE: 24 U/L (ref 11–51)

## 2015-12-22 MED ORDER — LIDOCAINE 5 % EX PTCH: 1.0000 | MEDICATED_PATCH | CUTANEOUS | 0 refills | Status: DC

## 2015-12-22 NOTE — ED Provider Notes (Signed)
-----------------------------------------   12:44 AM on 12/22/2015 -----------------------------------------   Blood pressure 139/85, pulse 87, temperature 98.4 F (36.9 C), temperature source Oral, resp. rate (!) 21, height 5\' 4"  (1.626 m), weight 152 lb (68.9 kg), SpO2 91 %.  Assuming care from Dr. Alfred Levins.  In short, Angela Suarez is a 65 y.o. female with a chief complaint of Chest Pain .  Refer to the original H&P for additional details.  The current plan of care is to follow patient's laboratory work which included a repeat troponin, LFTs, and lipase which were all within normal limits. Patient's received an extensive evaluation for her pleuritic chest pain and does not appear to have a life-threatening etiology at this time and will be discharged home.    Daymon Larsen, MD 12/22/15 [phone]

## 2015-12-26 ENCOUNTER — Encounter: Payer: Medicare HMO | Admitting: Physical Therapy

## 2015-12-28 ENCOUNTER — Encounter: Payer: Medicare HMO | Admitting: Physical Therapy

## 2015-12-31 ENCOUNTER — Encounter: Payer: Medicare HMO | Admitting: Physical Therapy

## 2016-01-03 ENCOUNTER — Encounter: Payer: Medicare HMO | Admitting: Physical Therapy

## 2016-01-07 ENCOUNTER — Encounter: Payer: Medicare HMO | Admitting: Physical Therapy

## 2016-01-10 ENCOUNTER — Encounter: Payer: Self-pay | Admitting: Physical Therapy

## 2016-06-27 ENCOUNTER — Other Ambulatory Visit: Payer: Self-pay | Admitting: Gastroenterology

## 2016-06-27 DIAGNOSIS — R1310 Dysphagia, unspecified: Secondary | ICD-10-CM

## 2016-07-08 ENCOUNTER — Ambulatory Visit
Admission: RE | Admit: 2016-07-08 | Discharge: 2016-07-08 | Disposition: A | Discharge status: Home / Self Care | Payer: Medicare HMO | Source: Ambulatory Visit | Attending: Gastroenterology | Admitting: Gastroenterology

## 2016-07-08 DIAGNOSIS — R1310 Dysphagia, unspecified: Secondary | ICD-10-CM | POA: Diagnosis not present

## 2016-07-08 DIAGNOSIS — K222 Esophageal obstruction: Secondary | ICD-10-CM | POA: Insufficient documentation

## 2016-07-08 DIAGNOSIS — K449 Diaphragmatic hernia without obstruction or gangrene: Secondary | ICD-10-CM | POA: Diagnosis not present

## 2016-09-10 ENCOUNTER — Other Ambulatory Visit: Payer: Self-pay | Admitting: Physical Medicine and Rehabilitation

## 2016-09-10 DIAGNOSIS — M5416 Radiculopathy, lumbar region: Secondary | ICD-10-CM

## 2016-09-15 ENCOUNTER — Ambulatory Visit: Payer: Medicare HMO | Admitting: Anesthesiology

## 2016-09-15 ENCOUNTER — Encounter
Admission: RE | Disposition: A | Discharge status: Home / Self Care | Payer: Self-pay | Source: Ambulatory Visit | Attending: Unknown Physician Specialty

## 2016-09-15 ENCOUNTER — Encounter: Payer: Self-pay | Admitting: *Deleted

## 2016-09-15 ENCOUNTER — Ambulatory Visit
Admission: RE | Admit: 2016-09-15 | Discharge: 2016-09-15 | Disposition: A | Discharge status: Home / Self Care | Payer: Medicare HMO | Source: Ambulatory Visit | Attending: Unknown Physician Specialty | Admitting: Unknown Physician Specialty

## 2016-09-15 DIAGNOSIS — G35 Multiple sclerosis: Secondary | ICD-10-CM | POA: Insufficient documentation

## 2016-09-15 DIAGNOSIS — J449 Chronic obstructive pulmonary disease, unspecified: Secondary | ICD-10-CM | POA: Diagnosis not present

## 2016-09-15 DIAGNOSIS — Z7982 Long term (current) use of aspirin: Secondary | ICD-10-CM | POA: Insufficient documentation

## 2016-09-15 DIAGNOSIS — I10 Essential (primary) hypertension: Secondary | ICD-10-CM | POA: Insufficient documentation

## 2016-09-15 DIAGNOSIS — K219 Gastro-esophageal reflux disease without esophagitis: Secondary | ICD-10-CM | POA: Insufficient documentation

## 2016-09-15 DIAGNOSIS — K5730 Diverticulosis of large intestine without perforation or abscess without bleeding: Secondary | ICD-10-CM | POA: Diagnosis not present

## 2016-09-15 DIAGNOSIS — F419 Anxiety disorder, unspecified: Secondary | ICD-10-CM | POA: Insufficient documentation

## 2016-09-15 DIAGNOSIS — R200 Anesthesia of skin: Secondary | ICD-10-CM | POA: Insufficient documentation

## 2016-09-15 DIAGNOSIS — F17210 Nicotine dependence, cigarettes, uncomplicated: Secondary | ICD-10-CM | POA: Insufficient documentation

## 2016-09-15 DIAGNOSIS — M1990 Unspecified osteoarthritis, unspecified site: Secondary | ICD-10-CM | POA: Diagnosis not present

## 2016-09-15 DIAGNOSIS — I69398 Other sequelae of cerebral infarction: Secondary | ICD-10-CM | POA: Diagnosis not present

## 2016-09-15 DIAGNOSIS — Z8673 Personal history of transient ischemic attack (TIA), and cerebral infarction without residual deficits: Secondary | ICD-10-CM | POA: Diagnosis not present

## 2016-09-15 DIAGNOSIS — K589 Irritable bowel syndrome without diarrhea: Secondary | ICD-10-CM | POA: Diagnosis not present

## 2016-09-15 DIAGNOSIS — K648 Other hemorrhoids: Secondary | ICD-10-CM | POA: Insufficient documentation

## 2016-09-15 DIAGNOSIS — Z8371 Family history of colonic polyps: Secondary | ICD-10-CM | POA: Insufficient documentation

## 2016-09-15 DIAGNOSIS — F329 Major depressive disorder, single episode, unspecified: Secondary | ICD-10-CM | POA: Insufficient documentation

## 2016-09-15 DIAGNOSIS — Z1211 Encounter for screening for malignant neoplasm of colon: Secondary | ICD-10-CM | POA: Diagnosis not present

## 2016-09-15 HISTORY — PX: COLONOSCOPY WITH PROPOFOL: SHX5780

## 2016-09-15 HISTORY — DX: Major depressive disorder, single episode, unspecified: F32.9

## 2016-09-15 HISTORY — DX: Unspecified osteoarthritis, unspecified site: M19.90

## 2016-09-15 HISTORY — DX: Irritable bowel syndrome, unspecified: K58.9

## 2016-09-15 HISTORY — DX: Multiple sclerosis: G35

## 2016-09-15 HISTORY — DX: Gastro-esophageal reflux disease without esophagitis: K21.9

## 2016-09-15 HISTORY — DX: Depression, unspecified: F32.A

## 2016-09-15 HISTORY — DX: Raised antibody titer: R76.0

## 2016-09-15 HISTORY — DX: Allergic rhinitis, unspecified: J30.9

## 2016-09-15 HISTORY — DX: Chronic obstructive pulmonary disease, unspecified: J44.9

## 2016-09-15 HISTORY — DX: Cervical disc disorder, unspecified, unspecified cervical region: M50.90

## 2016-09-15 HISTORY — DX: Unspecified thoracic, thoracolumbar and lumbosacral intervertebral disc disorder: M51.9

## 2016-09-15 SURGERY — COLONOSCOPY WITH PROPOFOL
Anesthesia: General

## 2016-09-15 MED ORDER — PROPOFOL 10 MG/ML IV BOLUS
INTRAVENOUS | Status: DC | PRN
  Administered 2016-09-15: 50 mg via INTRAVENOUS

## 2016-09-15 MED ORDER — SODIUM CHLORIDE 0.9 % IV SOLN
INTRAVENOUS | Status: DC
  Administered 2016-09-15: 12:00:00 via INTRAVENOUS

## 2016-09-15 MED ORDER — SODIUM CHLORIDE 0.9 % IV SOLN: INTRAVENOUS | Status: DC

## 2016-09-15 MED ORDER — PROPOFOL 10 MG/ML IV BOLUS
INTRAVENOUS | Status: AC
  Filled 2016-09-15: qty 20

## 2016-09-15 MED ORDER — PROPOFOL 500 MG/50ML IV EMUL
INTRAVENOUS | Status: DC | PRN
  Administered 2016-09-15: 150 ug/kg/min via INTRAVENOUS

## 2016-09-15 NOTE — H&P (Signed)
Primary Care Physician:  Adin Hector, MD Primary Gastroenterologist:  Dr. Vira Agar  Pre-Procedure History & Physical: HPI:  Angela Suarez is a 66 y.o. female is here for an colonoscopy.   Past Medical History:  Diagnosis Date  . Allergic rhinitis   . Anxiety   . Arthritis   . Cancer ([place])    skin  . Cervical disc disease   . COPD (chronic obstructive pulmonary disease) ([place])   . Depression   . GERD (gastroesophageal reflux disease)   . Hypertension   . IBS (irritable bowel syndrome)   . Lumbar disc disease   . Lupus anticoagulant positive   . Multiple sclerosis ([place])   . Stroke ([place])    06/2012  . Thoracic disc disease     Past Surgical History:  Procedure Laterality Date  . ABDOMINAL HYSTERECTOMY    . BREAST BIOPSY Right 1990   x 2, neg  . colon polyps    . colonoscipy    . POLYPECTOMY Right 1990   throat polyp removed by Dr Ladene Artist.   . TONSILLECTOMY      Prior to Admission medications   Medication Sig Start Date End Date Taking? Authorizing Provider  aspirin 325 MG tablet Take 325 mg by mouth daily.   Yes [provider]  atorvastatin (LIPITOR) 10 MG tablet Take 10 mg by mouth daily.   Yes [provider]  clonazePAM (KLONOPIN) 1 MG tablet Take 1 mg by mouth 3 (three) times daily as needed.    Yes [provider]  hydrochlorothiazide (HYDRODIURIL) 25 MG tablet Take 25 mg by mouth daily.   Yes [provider]  Interferon Beta-1a (REBIF REBIDOSE) 44 MCG/0.5ML SOAJ Inject into the skin 3 (three) times a week.   Yes [provider]  losartan (COZAAR) 25 MG tablet Take 25 mg by mouth daily.   Yes [provider]  pregabalin (LYRICA) 150 MG capsule Take 150 mg by mouth 2 (two) times daily.   Yes [provider]  sertraline (ZOLOFT) 100 MG tablet Take 100 mg by mouth daily.   Yes [provider]  Albuterol Sulfate 108 (90 Base) MCG/ACT AEPB Inhale into the lungs every 6 (six) hours.     [provider]  lidocaine (LIDODERM) 5 % Place 1 patch onto the skin daily. Patient not taking: Reported on 09/15/2016 12/22/15   Daymon Larsen, MD    Allergies as of 06/30/2016 - Review Complete 12/21/2015  Allergen Reaction Noted  . Codeine  12/03/2015  . Flagyl [metronidazole]  12/03/2015  . Prevacid [lansoprazole]  12/03/2015  . Vicodin [hydrocodone-acetaminophen]  12/03/2015    Family History  Problem Relation Age of Onset  . Breast cancer Mother 78  . Cancer Mother   . Breast cancer Paternal Aunt 17  . Breast cancer Paternal Aunt 68  . Cancer Father     Social History   Social History  . Marital status: Married    Spouse name: N/A  . Number of children: N/A  . Years of education: N/A   Occupational History  . Not on file.   Social History Main Topics  . Smoking status: Current Every Day Smoker  . Smokeless tobacco: Never Used  . Alcohol use No  . Drug use: No  . Sexual activity: Not on file   Other Topics Concern  . Not on file   Social History Narrative  . No narrative on file    Review of Systems: See HPI, otherwise  negative ROS  Physical Exam: BP 130/76   Pulse 82   Temp 97 F (36.1 C)   Resp 16   Ht 5\' 4"  (1.626 m)   Wt 69.4 kg (153 lb)   SpO2 100%   BMI 26.26 kg/m  General:   Alert,  pleasant and cooperative in NAD Head:  Normocephalic and atraumatic. Neck:  Supple; no masses or thyromegaly. Lungs:  Clear throughout to auscultation.    Heart:  Regular rate and rhythm. Abdomen:  Soft, nontender and nondistended. Normal bowel sounds, without guarding, and without rebound.   Neurologic:  Alert and  oriented x4;  grossly normal neurologically.  Impression/Plan: Angela Suarez is here for an colonoscopy to be performed for FH colon polyps  Risks, benefits, limitations, and alternatives regarding  colonoscopy have been reviewed with the patient.  Questions have been answered.  All parties agreeable.   Gaylyn Cheers, MD   09/15/2016, 12:46 PM

## 2016-09-15 NOTE — Anesthesia Post-op Follow-up Note (Cosign Needed)
Anesthesia QCDR form completed.        

## 2016-09-15 NOTE — Anesthesia Preprocedure Evaluation (Signed)
Anesthesia Evaluation  Patient identified by MRN, date of birth, ID band Patient awake    Reviewed: Allergy & Precautions, NPO status , Patient's Chart, lab work & pertinent test results  History of Anesthesia Complications Negative for: history of anesthetic complications  Airway Mallampati: I  TM Distance: >3 FB Neck ROM: Full    Dental no notable dental hx.    Pulmonary COPD, Current Smoker,    breath sounds clear to auscultation- rhonchi (-) wheezing      Cardiovascular hypertension, Pt. on medications (-) CAD, (-) Past MI and (-) Cardiac Stents  Rhythm:Regular Rate:Normal - Systolic murmurs and - Diastolic murmurs    Neuro/Psych PSYCHIATRIC DISORDERS Anxiety Depression Multiple sclerosis  CVA (residual L face and L hand numbness), Residual Symptoms    GI/Hepatic Neg liver ROS, GERD  ,  Endo/Other  negative endocrine ROSneg diabetes  Renal/GU negative Renal ROS     Musculoskeletal  (+) Arthritis ,   Abdominal (+) - obese,   Peds  Hematology negative hematology ROS (+)   Anesthesia Other Findings Past Medical History: No date: Allergic rhinitis No date: Anxiety No date: Arthritis No date: Cancer ([place])     Comment: skin No date: Cervical disc disease No date: COPD (chronic obstructive pulmonary disease) (* No date: Depression No date: GERD (gastroesophageal reflux disease) No date: Hypertension No date: IBS (irritable bowel syndrome) No date: Lumbar disc disease No date: Lupus anticoagulant positive No date: Multiple sclerosis ([place]) No date: Stroke [facility])     Comment: 06/2012 No date: Thoracic disc disease   Reproductive/Obstetrics                             Anesthesia Physical Anesthesia Plan  ASA: III  Anesthesia Plan: General   Post-op Pain Management:    Induction: Intravenous  PONV Risk Score and Plan: 1 and Propofol  Airway Management Planned: Natural  Airway  Additional Equipment:   Intra-op Plan:   Post-operative Plan:   Informed Consent: I have reviewed the patients History and Physical, chart, labs and discussed the procedure including the risks, benefits and alternatives for the proposed anesthesia with the patient or authorized representative who has indicated his/her understanding and acceptance.   Dental advisory given  Plan Discussed with: CRNA and Anesthesiologist  Anesthesia Plan Comments:         Anesthesia Quick Evaluation

## 2016-09-15 NOTE — Op Note (Signed)
[facility] Gastroenterology Patient Name: Angela Suarez Procedure Date: 09/15/2016 12:28 PM MRN: [id number] Account #: [id number] Date of Birth: 02/16/1951 Admit Type: Outpatient Age: 66 Room: [facility] ENDO ROOM 3 Gender: Female Note Status: Finalized Procedure:            Colonoscopy Indications:          Screening for colorectal malignant neoplasm Providers:            Manya Silvas, MD Referring MD:         Ramonita Lab, MD (Referring MD) Medicines:            Propofol per Anesthesia Complications:        No immediate complications. Procedure:            Pre-Anesthesia Assessment:                       - After reviewing the risks and benefits, the patient                        was deemed in satisfactory condition to undergo the                        procedure.                       After obtaining informed consent, the colonoscope was                        passed under direct vision. Throughout the procedure,                        the patient's blood pressure, pulse, and oxygen                        saturations were monitored continuously. The                        Colonoscope was introduced through the anus and                        advanced to the the cecum, identified by appendiceal                        orifice and ileocecal valve. The colonoscopy was                        performed without difficulty. The patient tolerated the                        procedure well. The quality of the bowel preparation                        was excellent. Findings:      A few small-mouthed diverticula were found in the sigmoid colon.      Internal hemorrhoids were found during endoscopy. The hemorrhoids were       small and Grade I (internal hemorrhoids that do not prolapse).      The exam was otherwise without abnormality. Impression:           - Diverticulosis in the sigmoid colon.                       -  Internal hemorrhoids.                       - The  examination was otherwise normal.                       - No specimens collected. Recommendation:       - Repeat colonoscopy in 10 years for screening purposes. Manya Silvas, MD 09/15/2016 1:14:22 PM This report has been signed electronically. Number of Addenda: 0 Note Initiated On: 09/15/2016 12:28 PM Scope Withdrawal Time: 0 hours 11 minutes 38 seconds  Total Procedure Duration: 0 hours 19 minutes 6 seconds       [facility]

## 2016-09-15 NOTE — Anesthesia Procedure Notes (Signed)
Performed by: Shaina Gullatt Pre-anesthesia Checklist: Patient identified, Emergency Drugs available, Suction available, Patient being monitored and Timeout performed Patient Re-evaluated:Patient Re-evaluated prior to inductionOxygen Delivery Method: Nasal cannula Preoxygenation: Pre-oxygenation with 100% oxygen Intubation Type: IV induction       

## 2016-09-15 NOTE — Transfer of Care (Signed)
Immediate Anesthesia Transfer of Care Note  Patient: Angela Suarez  Procedure(s) Performed: Procedure(s): COLONOSCOPY WITH PROPOFOL (N/A)  Patient Location: PACU  Anesthesia Type:General  Level of Consciousness: sedated  Airway & Oxygen Therapy: Patient Spontanous Breathing and Patient connected to nasal cannula oxygen  Post-op Assessment: Report given to RN and Post -op Vital signs reviewed and stable  Post vital signs: Reviewed and stable  Last Vitals:  Vitals:   09/15/16 1158 09/15/16 1315  BP: 130/76 106/63  Pulse: 82   Resp: 16 16  Temp: 36.1 C     Last Pain: There were no vitals filed for this visit.       Complications: No apparent anesthesia complications

## 2016-09-15 NOTE — Anesthesia Postprocedure Evaluation (Signed)
Anesthesia Post Note  Patient: Angela Suarez  Procedure(s) Performed: Procedure(s) (LRB): COLONOSCOPY WITH PROPOFOL (N/A)  Patient location during evaluation: Endoscopy Anesthesia Type: General Level of consciousness: awake and alert and oriented Pain management: pain level controlled Vital Signs Assessment: post-procedure vital signs reviewed and stable Respiratory status: spontaneous breathing, nonlabored ventilation and respiratory function stable Cardiovascular status: blood pressure returned to baseline and stable Postop Assessment: no signs of nausea or vomiting Anesthetic complications: no     Last Vitals:  Vitals:   09/15/16 1334 09/15/16 1344  BP: 129/76 132/78  Pulse: 70 70  Resp: 15 18  Temp:      Last Pain:  Vitals:   09/15/16 1314  TempSrc: Tympanic                 [name]

## 2016-09-16 ENCOUNTER — Encounter: Payer: Self-pay | Admitting: Unknown Physician Specialty

## 2016-09-25 ENCOUNTER — Telehealth: Payer: Self-pay | Admitting: Physical Medicine and Rehabilitation

## 2016-09-26 ENCOUNTER — Ambulatory Visit
Admission: RE | Admit: 2016-09-26 | Discharge: 2016-09-26 | Disposition: A | Discharge status: Home / Self Care | Payer: Medicare HMO | Source: Ambulatory Visit | Attending: Physical Medicine and Rehabilitation | Admitting: Physical Medicine and Rehabilitation

## 2016-10-01 ENCOUNTER — Ambulatory Visit: Payer: Medicare HMO

## 2016-10-28 ENCOUNTER — Ambulatory Visit
Admission: RE | Admit: 2016-10-28 | Discharge: 2016-10-28 | Disposition: A | Discharge status: Home / Self Care | Payer: Medicare HMO | Source: Ambulatory Visit | Attending: Physical Medicine and Rehabilitation | Admitting: Physical Medicine and Rehabilitation

## 2016-10-28 DIAGNOSIS — M5416 Radiculopathy, lumbar region: Secondary | ICD-10-CM | POA: Insufficient documentation

## 2016-10-28 DIAGNOSIS — M48061 Spinal stenosis, lumbar region without neurogenic claudication: Secondary | ICD-10-CM | POA: Diagnosis not present

## 2016-11-07 ENCOUNTER — Other Ambulatory Visit: Payer: Self-pay | Admitting: Internal Medicine

## 2016-11-07 DIAGNOSIS — Z1231 Encounter for screening mammogram for malignant neoplasm of breast: Secondary | ICD-10-CM

## 2016-12-11 ENCOUNTER — Telehealth: Payer: Self-pay | Admitting: *Deleted

## 2016-12-11 DIAGNOSIS — Z122 Encounter for screening for malignant neoplasm of respiratory organs: Secondary | ICD-10-CM

## 2016-12-11 NOTE — Telephone Encounter (Signed)
Notified patient that annual lung cancer screening low dose CT scan is due currently or will be in near future. Confirmed that patient is within the age range of 55-77, and asymptomatic, (no signs or symptoms of lung cancer). Patient denies illness that would prevent curative treatment for lung cancer if found. Verified smoking history, (current, 52 pack year). The shared decision making visit was done 09/20/14. Patient is agreeable for CT scan being scheduled.

## 2016-12-23 ENCOUNTER — Ambulatory Visit
Admission: RE | Admit: 2016-12-23 | Discharge: 2016-12-23 | Disposition: A | Discharge status: Home / Self Care | Payer: Medicare HMO | Source: Ambulatory Visit | Attending: Oncology | Admitting: Oncology

## 2016-12-23 ENCOUNTER — Ambulatory Visit
Admission: RE | Admit: 2016-12-23 | Discharge: 2016-12-23 | Disposition: A | Discharge status: Home / Self Care | Payer: Medicare HMO | Source: Ambulatory Visit | Attending: Internal Medicine | Admitting: Internal Medicine

## 2016-12-23 DIAGNOSIS — Z122 Encounter for screening for malignant neoplasm of respiratory organs: Secondary | ICD-10-CM | POA: Diagnosis present

## 2016-12-23 DIAGNOSIS — I2510 Atherosclerotic heart disease of native coronary artery without angina pectoris: Secondary | ICD-10-CM | POA: Insufficient documentation

## 2016-12-23 DIAGNOSIS — I7789 Other specified disorders of arteries and arterioles: Secondary | ICD-10-CM | POA: Diagnosis not present

## 2016-12-23 DIAGNOSIS — F17210 Nicotine dependence, cigarettes, uncomplicated: Secondary | ICD-10-CM | POA: Insufficient documentation

## 2016-12-23 DIAGNOSIS — Z1231 Encounter for screening mammogram for malignant neoplasm of breast: Secondary | ICD-10-CM | POA: Diagnosis present

## 2016-12-23 DIAGNOSIS — J432 Centrilobular emphysema: Secondary | ICD-10-CM | POA: Insufficient documentation

## 2016-12-23 DIAGNOSIS — I700 Atherosclerosis of aorta: Secondary | ICD-10-CM | POA: Insufficient documentation

## 2016-12-26 ENCOUNTER — Telehealth: Payer: Self-pay | Admitting: *Deleted

## 2016-12-26 NOTE — Telephone Encounter (Signed)
Notified patient of LDCT lung cancer screening program results with recommendation for 12 month follow up imaging. Also notified of incidental findings noted below and is encouraged to discuss further with PCP who will receive a copy of this note and/or the CT report. Patient verbalizes understanding.   IMPRESSION: 1. Lung-RADS 2, benign appearance or behavior. Continue annual screening with low-dose chest CT without contrast in 12 months. 2. Coronary artery atherosclerosis. Aortic Atherosclerosis (ICD10-I70.0). 3.  Emphysema (ICD10-J43.9). 4. Pulmonary artery enlargement suggests pulmonary arterial hypertension.

## 2017-06-04 IMAGING — CR DG CHEST 2V
2 series · 2 of 2 positions shown · non-contrast
Comparison: CT of the chest performed 11/07/2015

CLINICAL DATA: Acute onset of generalized chest pain, radiating to
the neck and back. Initial encounter.

EXAM:
CHEST  2 VIEW

[chest pa]
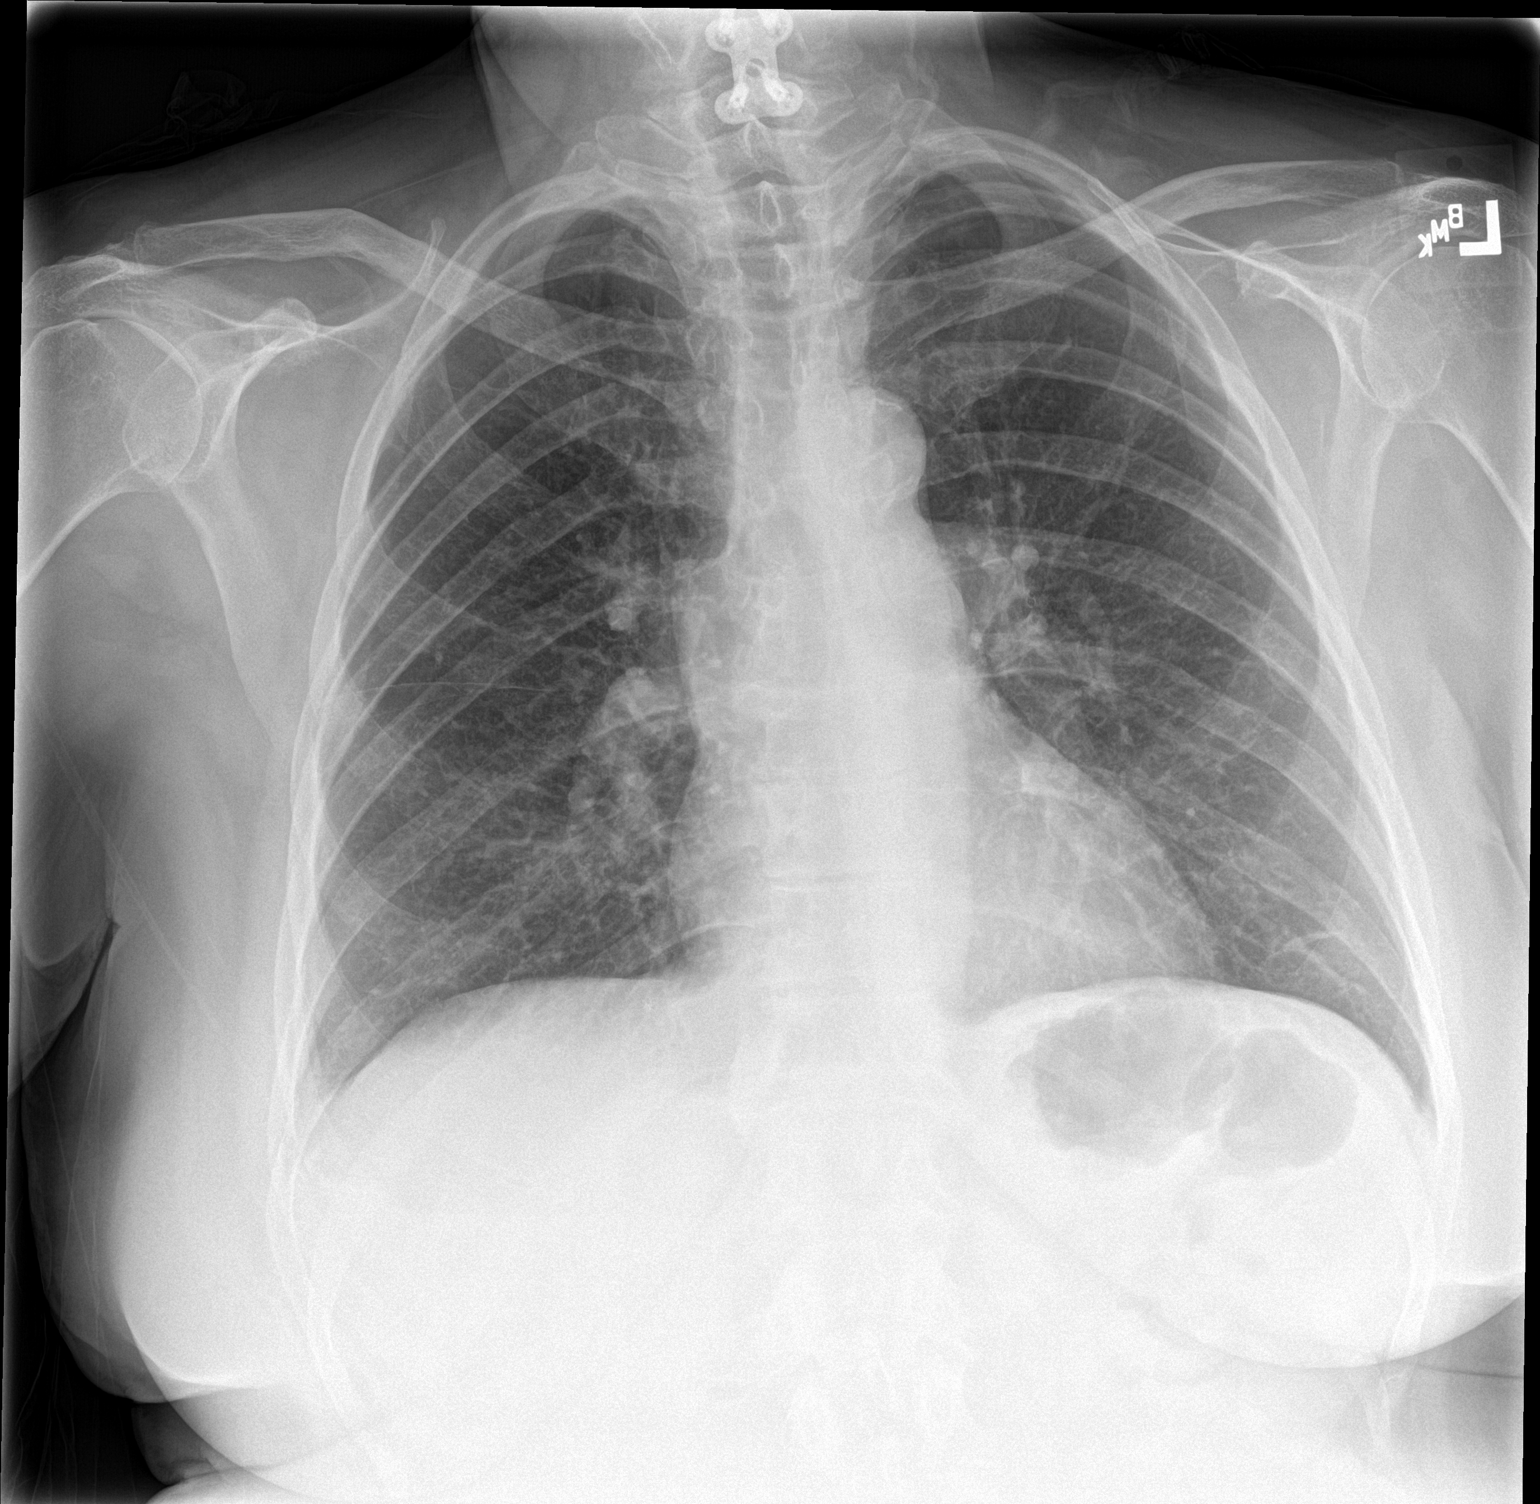

[chest lat]
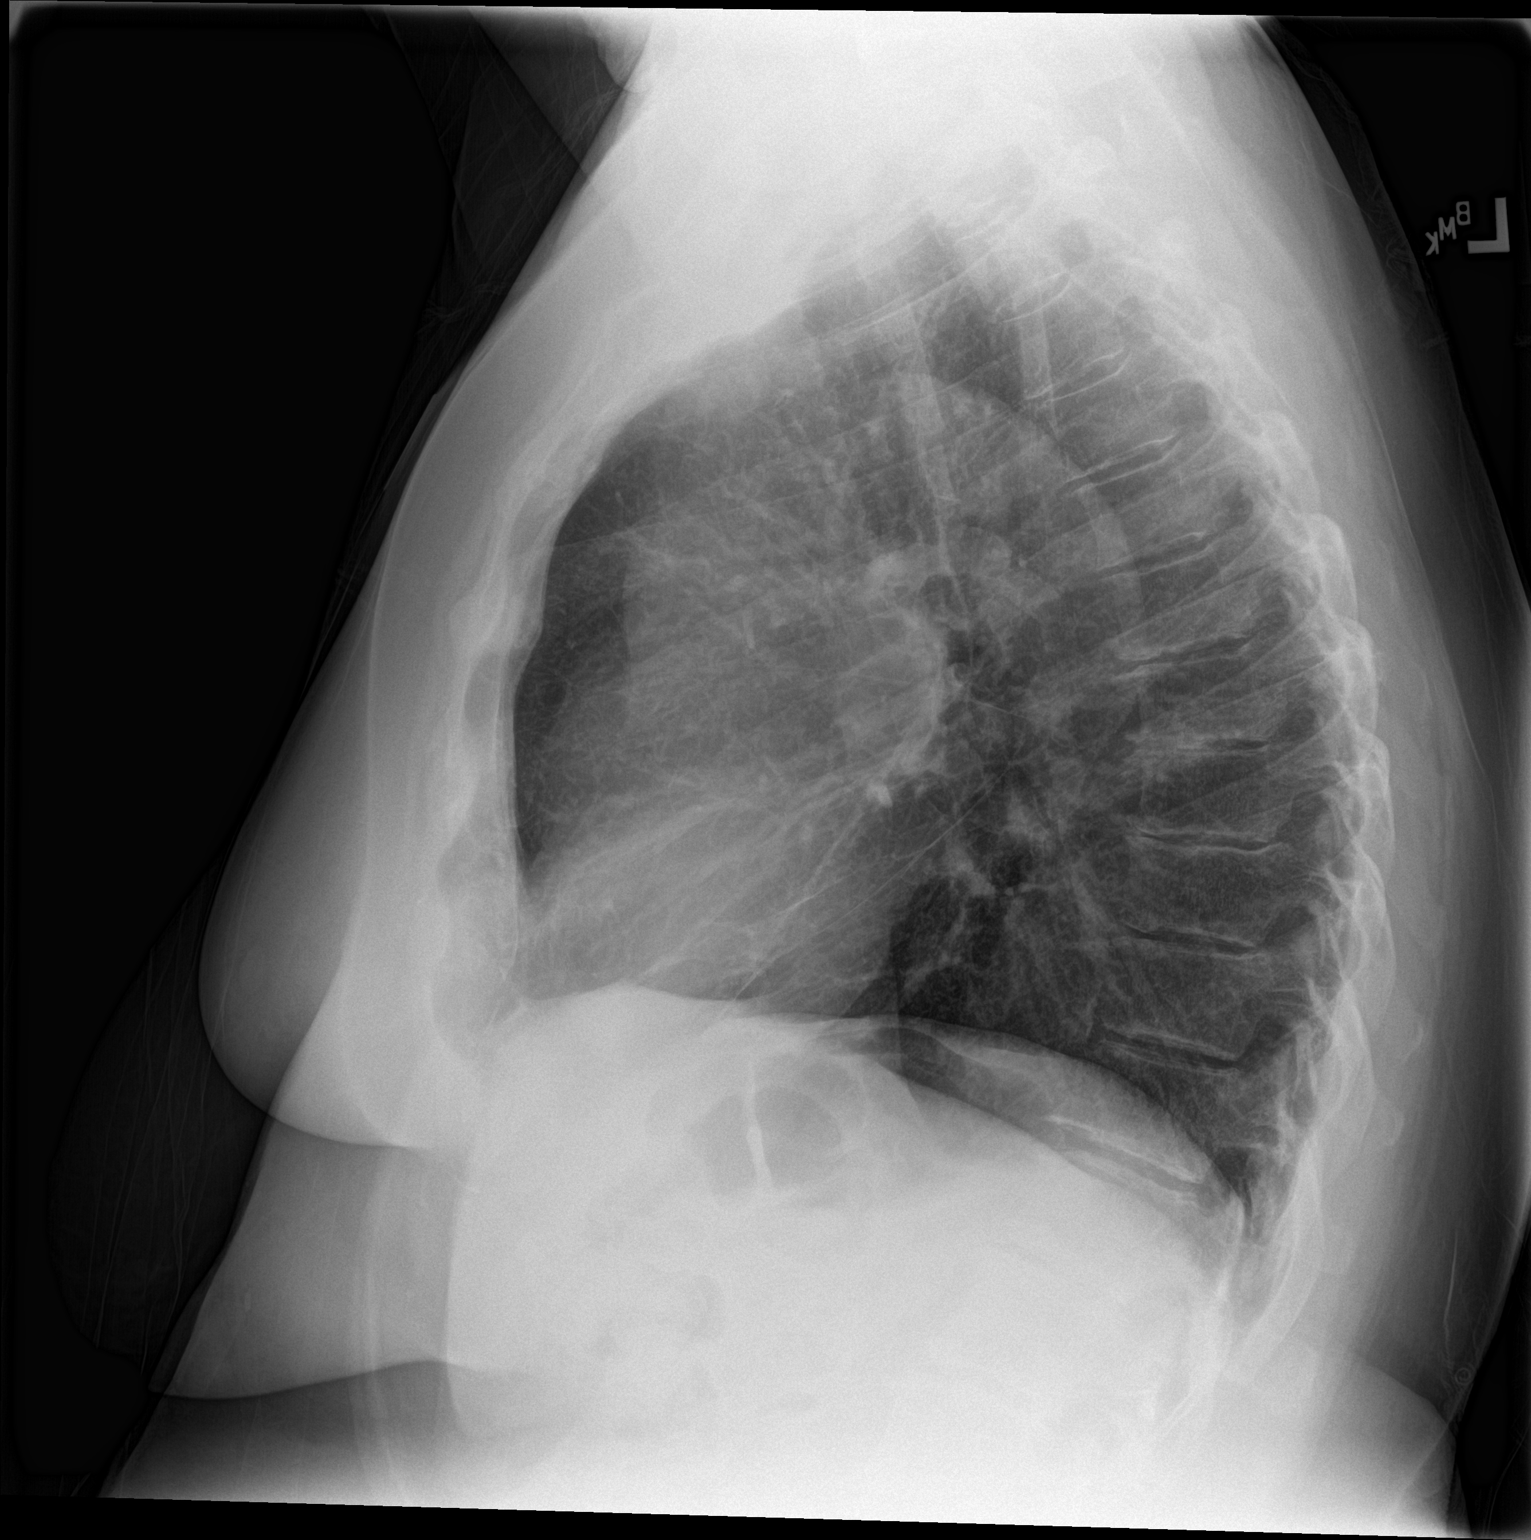

[2 of 2 positions shown; findings below may reference images not displayed]

FINDINGS: The lungs are well-aerated and clear. There is no evidence of focal
opacification, pleural effusion or pneumothorax.

The heart is normal in size; the mediastinal contour is within
normal limits. No acute osseous abnormalities are seen. Cervical
spinal fusion hardware is partially imaged.
IMPRESSION: No acute cardiopulmonary process seen.

## 2017-10-06 ENCOUNTER — Other Ambulatory Visit: Payer: Self-pay | Admitting: Ophthalmology

## 2017-10-06 DIAGNOSIS — G453 Amaurosis fugax: Secondary | ICD-10-CM

## 2017-10-22 ENCOUNTER — Ambulatory Visit: Payer: Self-pay

## 2017-10-24 ENCOUNTER — Ambulatory Visit
Admission: RE | Admit: 2017-10-24 | Discharge: 2017-10-24 | Disposition: A | Discharge status: Home / Self Care | Payer: Medicare HMO | Source: Ambulatory Visit | Attending: Ophthalmology | Admitting: Ophthalmology

## 2017-10-24 DIAGNOSIS — G35 Multiple sclerosis: Secondary | ICD-10-CM | POA: Diagnosis not present

## 2017-10-24 DIAGNOSIS — G453 Amaurosis fugax: Secondary | ICD-10-CM | POA: Diagnosis present

## 2017-10-24 MED ORDER — GADOBENATE DIMEGLUMINE 529 MG/ML IV SOLN
13.0000 mL | Freq: Once | INTRAVENOUS | Status: AC | PRN
  Administered 2017-10-24: 13 mL via INTRAVENOUS

## 2017-11-18 ENCOUNTER — Other Ambulatory Visit: Payer: Self-pay | Admitting: Internal Medicine

## 2017-11-18 DIAGNOSIS — Z1231 Encounter for screening mammogram for malignant neoplasm of breast: Secondary | ICD-10-CM

## 2017-12-07 ENCOUNTER — Telehealth: Payer: Self-pay

## 2017-12-07 NOTE — Telephone Encounter (Signed)
Call pt regarding lung screening left pt message at 12:08.

## 2017-12-08 ENCOUNTER — Telehealth: Payer: Self-pay | Admitting: *Deleted

## 2017-12-08 DIAGNOSIS — Z122 Encounter for screening for malignant neoplasm of respiratory organs: Secondary | ICD-10-CM

## 2017-12-08 DIAGNOSIS — Z87891 Personal history of nicotine dependence: Secondary | ICD-10-CM

## 2017-12-08 NOTE — Telephone Encounter (Signed)
Patient has been notified that annual lung cancer screening low dose CT scan is due currently or will be in near future. Confirmed that patient is within the age range of 55-77, and asymptomatic, (no signs or symptoms of lung cancer). Patient denies illness that would prevent curative treatment for lung cancer if found. Verified smoking history, (current, 53 pack year). The shared decision making visit was done 09/20/14. Patient is agreeable for CT scan being scheduled.

## 2017-12-28 ENCOUNTER — Ambulatory Visit
Admission: RE | Admit: 2017-12-28 | Discharge: 2017-12-28 | Disposition: A | Discharge status: Home / Self Care | Payer: Medicare HMO | Source: Ambulatory Visit | Attending: Internal Medicine | Admitting: Internal Medicine

## 2017-12-28 DIAGNOSIS — Z1231 Encounter for screening mammogram for malignant neoplasm of breast: Secondary | ICD-10-CM

## 2017-12-29 ENCOUNTER — Ambulatory Visit
Admission: RE | Admit: 2017-12-29 | Discharge: 2017-12-29 | Disposition: A | Discharge status: Home / Self Care | Payer: Medicare HMO | Source: Ambulatory Visit | Attending: Oncology | Admitting: Oncology

## 2017-12-29 DIAGNOSIS — I2510 Atherosclerotic heart disease of native coronary artery without angina pectoris: Secondary | ICD-10-CM | POA: Insufficient documentation

## 2017-12-29 DIAGNOSIS — Z87891 Personal history of nicotine dependence: Secondary | ICD-10-CM | POA: Insufficient documentation

## 2017-12-29 DIAGNOSIS — I700 Atherosclerosis of aorta: Secondary | ICD-10-CM | POA: Insufficient documentation

## 2017-12-29 DIAGNOSIS — J432 Centrilobular emphysema: Secondary | ICD-10-CM | POA: Diagnosis not present

## 2017-12-29 DIAGNOSIS — Z122 Encounter for screening for malignant neoplasm of respiratory organs: Secondary | ICD-10-CM | POA: Diagnosis not present

## 2017-12-30 ENCOUNTER — Encounter: Payer: Self-pay | Admitting: *Deleted

## 2018-04-16 ENCOUNTER — Other Ambulatory Visit: Payer: Self-pay | Admitting: Family Medicine

## 2018-04-16 DIAGNOSIS — R1032 Left lower quadrant pain: Secondary | ICD-10-CM

## 2018-04-23 ENCOUNTER — Ambulatory Visit: Admission: RE | Admit: 2018-04-23 | Payer: Medicare HMO | Source: Ambulatory Visit

## 2018-04-29 ENCOUNTER — Ambulatory Visit: Admission: RE | Admit: 2018-04-29 | Payer: Medicare HMO | Source: Ambulatory Visit

## 2018-04-30 ENCOUNTER — Other Ambulatory Visit: Payer: Self-pay | Admitting: Internal Medicine

## 2018-04-30 DIAGNOSIS — R102 Pelvic and perineal pain: Secondary | ICD-10-CM

## 2018-05-05 ENCOUNTER — Ambulatory Visit: Payer: Medicare HMO

## 2018-05-05 ENCOUNTER — Ambulatory Visit
Admission: RE | Admit: 2018-05-05 | Discharge: 2018-05-05 | Disposition: A | Discharge status: Home / Self Care | Payer: Medicare HMO | Source: Ambulatory Visit | Attending: Internal Medicine | Admitting: Internal Medicine

## 2018-05-05 DIAGNOSIS — R102 Pelvic and perineal pain: Secondary | ICD-10-CM | POA: Diagnosis not present

## 2018-05-14 ENCOUNTER — Ambulatory Visit
Admission: RE | Admit: 2018-05-14 | Discharge: 2018-05-14 | Disposition: A | Discharge status: Home / Self Care | Payer: Medicare HMO | Source: Ambulatory Visit | Attending: Family Medicine | Admitting: Family Medicine

## 2018-05-14 DIAGNOSIS — R1032 Left lower quadrant pain: Secondary | ICD-10-CM | POA: Insufficient documentation

## 2018-05-14 MED ORDER — IOPAMIDOL (ISOVUE-300) INJECTION 61%
85.0000 mL | Freq: Once | INTRAVENOUS | Status: AC | PRN
  Administered 2018-05-14: 85 mL via INTRAVENOUS

## 2018-06-07 IMAGING — MG MM DIGITAL SCREENING BILAT W/ TOMO W/ CAD
8 of 12 series · 8 of 28 positions shown · non-contrast
Comparison: Previous exam(s).

CLINICAL DATA: Screening.

EXAM:
2D DIGITAL SCREENING BILATERAL MAMMOGRAM WITH CAD AND ADJUNCT TOMO

[L CC]
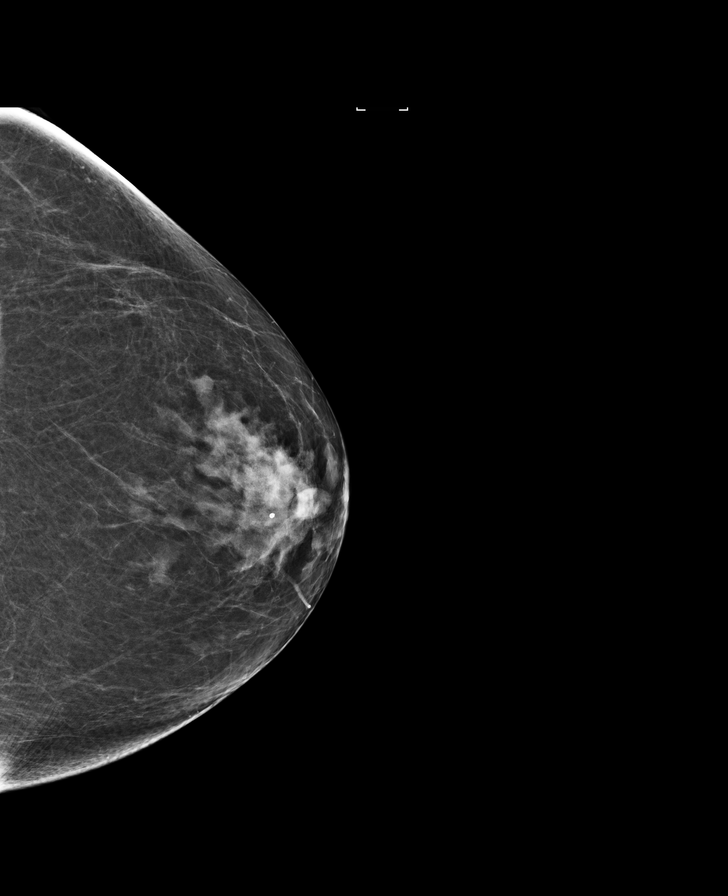

[R MLO synth-2D]
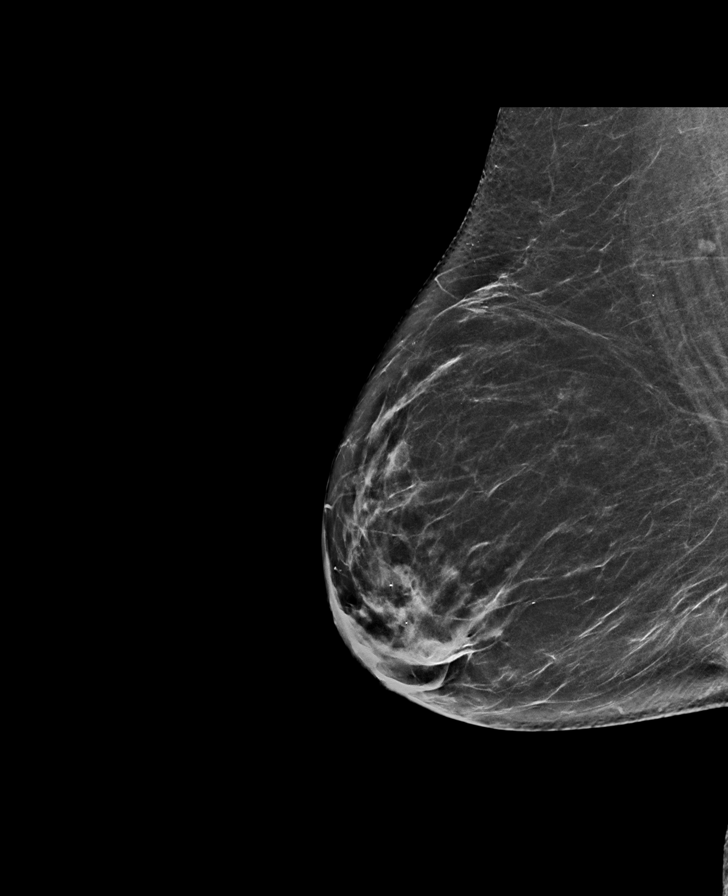

[R CC synth-2D]
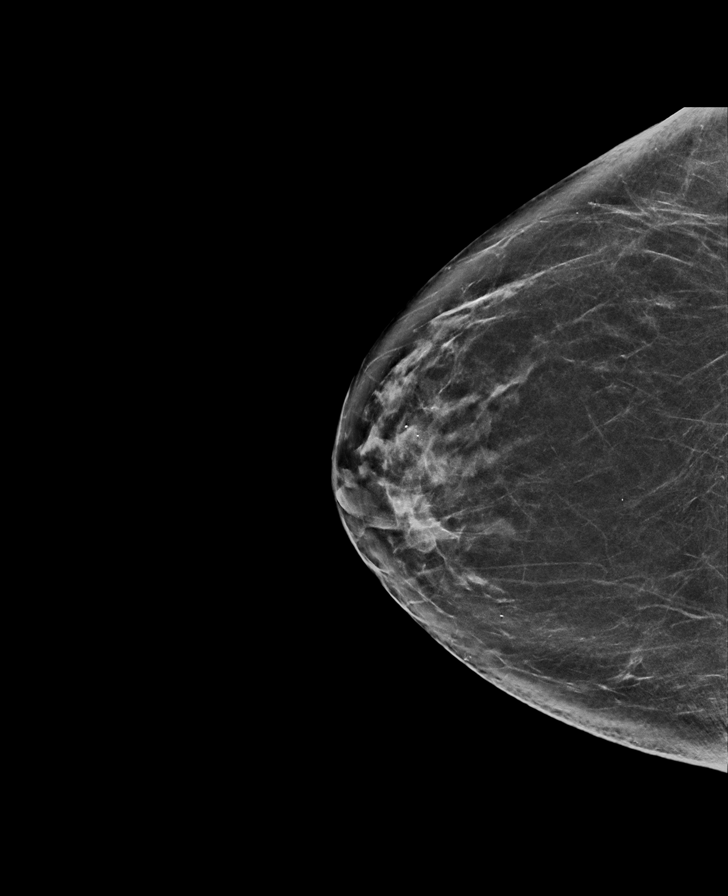

[L MLO synth-2D]
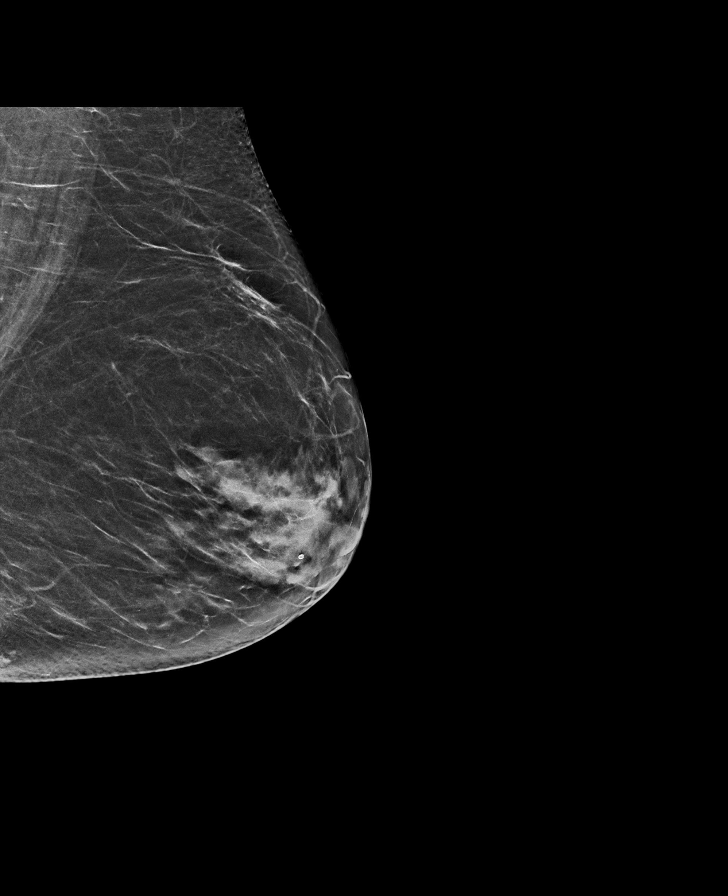

[R CC]
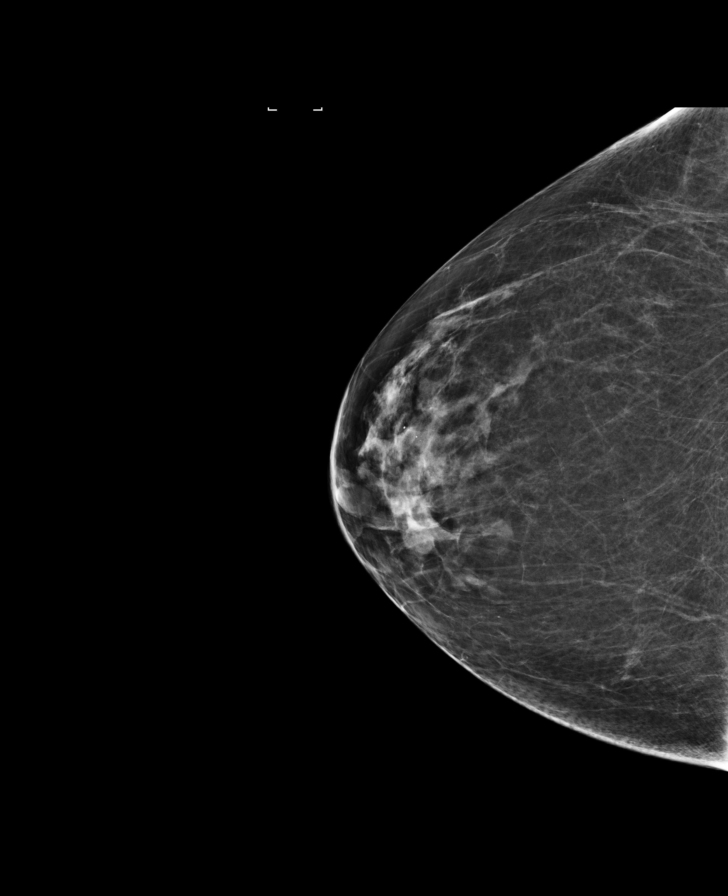

[L CC synth-2D]
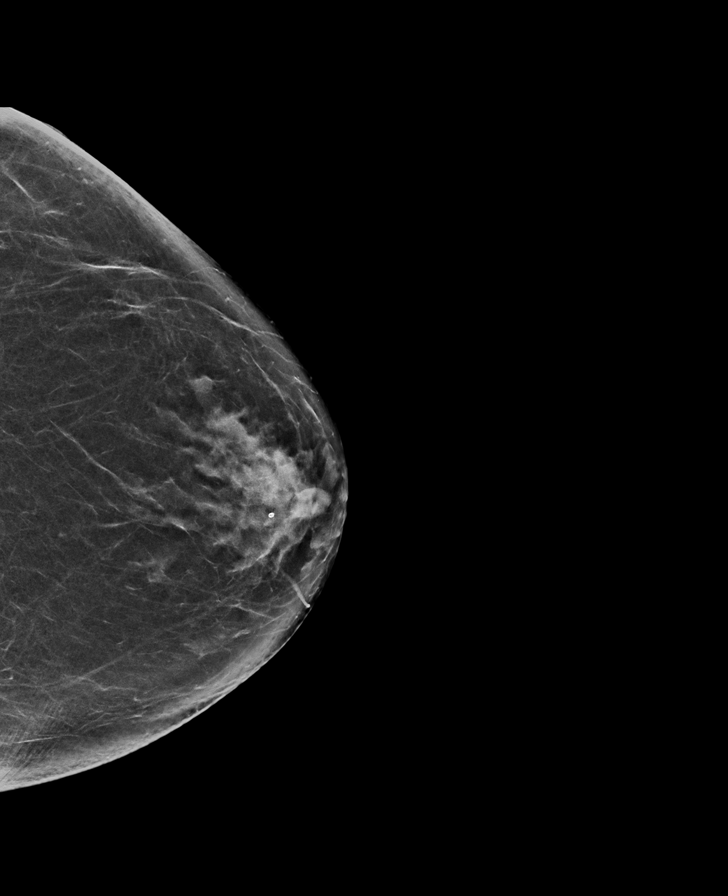

[L MLO]
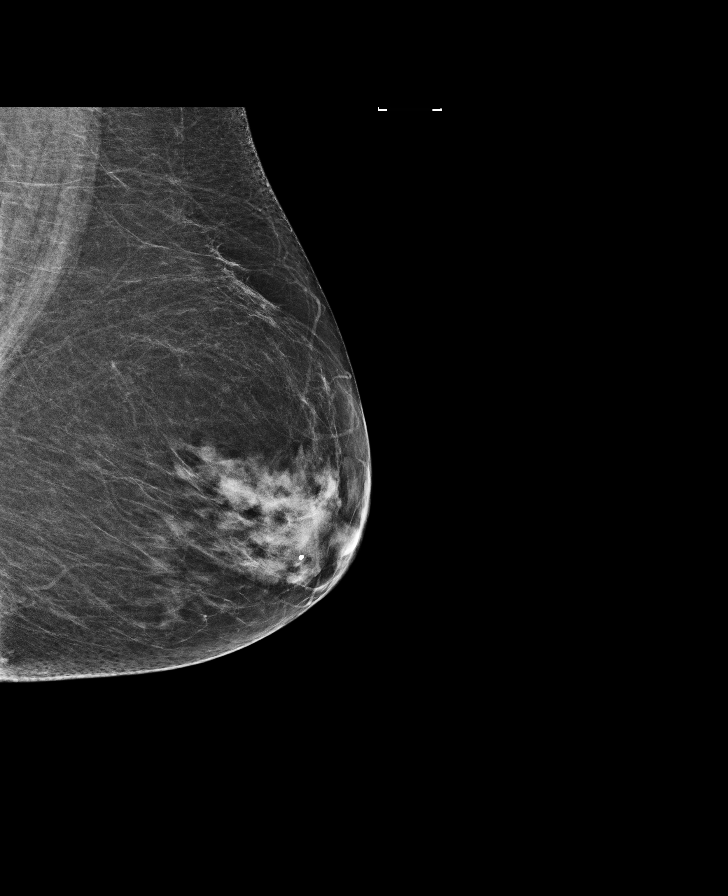

[R MLO]
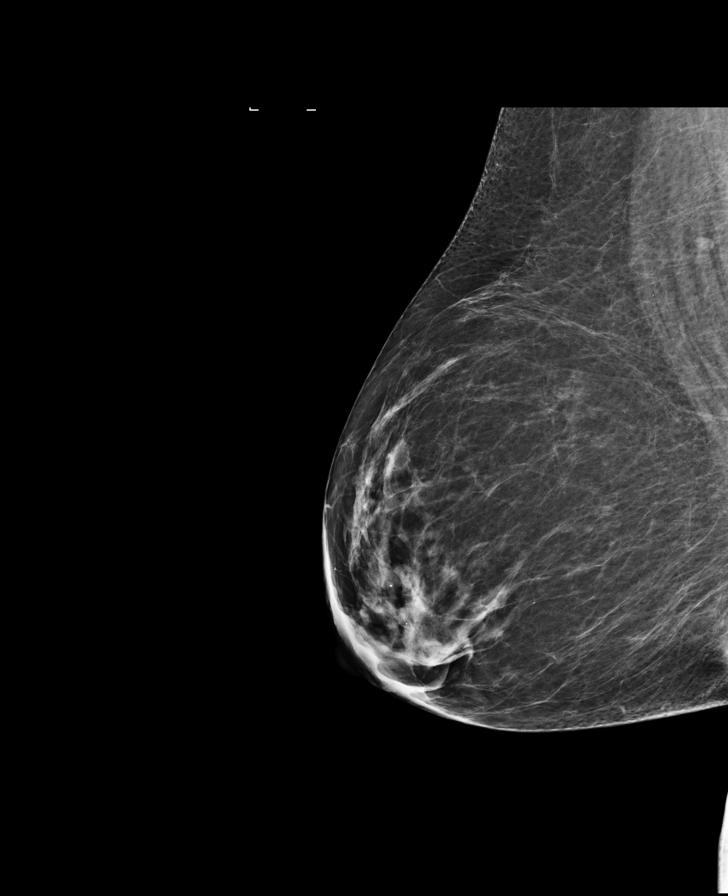

[8 of 28 positions shown; findings below may reference images not displayed]

ACR Breast Density Category c: The breast tissue is heterogeneously
dense, which may obscure small masses.
FINDINGS: There are no findings suspicious for malignancy. Images were
processed with CAD.
IMPRESSION: No mammographic evidence of malignancy. A result letter of this
screening mammogram will be mailed directly to the patient.

RECOMMENDATION:
Screening mammogram in one year. (Code:[id number])

BI-RADS CATEGORY  1: Negative.

## 2018-11-22 ENCOUNTER — Other Ambulatory Visit: Payer: Self-pay | Admitting: Internal Medicine

## 2018-11-22 DIAGNOSIS — Z1231 Encounter for screening mammogram for malignant neoplasm of breast: Secondary | ICD-10-CM

## 2018-11-23 ENCOUNTER — Other Ambulatory Visit: Payer: Self-pay | Admitting: Internal Medicine

## 2018-11-23 DIAGNOSIS — Z1231 Encounter for screening mammogram for malignant neoplasm of breast: Secondary | ICD-10-CM

## 2018-12-27 ENCOUNTER — Telehealth: Payer: Self-pay

## 2018-12-27 DIAGNOSIS — Z122 Encounter for screening for malignant neoplasm of respiratory organs: Secondary | ICD-10-CM

## 2018-12-27 DIAGNOSIS — Z87891 Personal history of nicotine dependence: Secondary | ICD-10-CM

## 2018-12-29 NOTE — Telephone Encounter (Signed)
Patient has been notified that annual lung cancer screening low dose CT scan is due currently or will be in near future. Confirmed that patient is within the age range of 55-77, and asymptomatic, (no signs or symptoms of lung cancer). Patient denies illness that would prevent curative treatment for lung cancer if found. Verified smoking history, (former, quit 12/21/18, 53 pack year). The shared decision making visit was done 09/20/14. Patient is agreeable for CT scan being scheduled.

## 2018-12-30 ENCOUNTER — Ambulatory Visit
Admission: RE | Admit: 2018-12-30 | Discharge: 2018-12-30 | Disposition: A | Discharge status: Home / Self Care | Payer: Medicare HMO | Source: Ambulatory Visit | Attending: Internal Medicine | Admitting: Internal Medicine

## 2018-12-30 DIAGNOSIS — Z1231 Encounter for screening mammogram for malignant neoplasm of breast: Secondary | ICD-10-CM | POA: Diagnosis not present

## 2019-01-04 ENCOUNTER — Other Ambulatory Visit: Payer: Self-pay

## 2019-01-04 ENCOUNTER — Ambulatory Visit
Admission: RE | Admit: 2019-01-04 | Discharge: 2019-01-04 | Disposition: A | Discharge status: Home / Self Care | Payer: Medicare HMO | Source: Ambulatory Visit | Attending: Oncology | Admitting: Oncology

## 2019-01-04 DIAGNOSIS — Z122 Encounter for screening for malignant neoplasm of respiratory organs: Secondary | ICD-10-CM

## 2019-01-04 DIAGNOSIS — Z87891 Personal history of nicotine dependence: Secondary | ICD-10-CM | POA: Diagnosis present

## 2019-01-06 ENCOUNTER — Encounter: Payer: Self-pay | Admitting: *Deleted

## 2019-04-30 ENCOUNTER — Telehealth: Payer: Self-pay | Admitting: *Deleted

## 2019-04-30 NOTE — Telephone Encounter (Signed)
Pt has been put on the wait list for the first covid vaccine.

## 2019-12-28 ENCOUNTER — Other Ambulatory Visit: Payer: Self-pay | Admitting: Family Medicine

## 2019-12-28 ENCOUNTER — Other Ambulatory Visit: Payer: Self-pay | Admitting: Internal Medicine

## 2019-12-28 DIAGNOSIS — Z1231 Encounter for screening mammogram for malignant neoplasm of breast: Secondary | ICD-10-CM

## 2019-12-30 ENCOUNTER — Telehealth: Payer: Self-pay | Admitting: *Deleted

## 2019-12-30 NOTE — Telephone Encounter (Signed)
(  12/30/2019) Pt notified that lung cancer screening imaging is due currently or in the near future. Verified smoking history (Current Smoker,0.75 ppd). Tentative appt for 01/13/20 @ 2:30pm SRW

## 2020-01-01 ENCOUNTER — Other Ambulatory Visit: Payer: Self-pay | Admitting: *Deleted

## 2020-01-01 DIAGNOSIS — Z122 Encounter for screening for malignant neoplasm of respiratory organs: Secondary | ICD-10-CM

## 2020-01-01 DIAGNOSIS — Z87891 Personal history of nicotine dependence: Secondary | ICD-10-CM

## 2020-01-01 NOTE — Progress Notes (Signed)
Current smoker, 53.75 pack year

## 2020-01-13 ENCOUNTER — Ambulatory Visit: Admission: RE | Admit: 2020-01-13 | Payer: Medicare HMO | Source: Ambulatory Visit

## 2020-02-05 ENCOUNTER — Telehealth: Payer: Self-pay | Admitting: *Deleted

## 2020-02-05 NOTE — Telephone Encounter (Signed)
Left a voicemail to inform patient that it is time for her lung cancer screening scan. Instructed patient to call back to schedule her CT scan and update information.

## 2020-02-06 ENCOUNTER — Ambulatory Visit
Admission: RE | Admit: 2020-02-06 | Discharge: 2020-02-06 | Disposition: A | Discharge status: Home / Self Care | Payer: Medicare HMO | Source: Ambulatory Visit | Attending: Oncology | Admitting: Oncology

## 2020-02-06 ENCOUNTER — Other Ambulatory Visit: Payer: Self-pay

## 2020-02-06 DIAGNOSIS — Z122 Encounter for screening for malignant neoplasm of respiratory organs: Secondary | ICD-10-CM | POA: Insufficient documentation

## 2020-02-06 DIAGNOSIS — Z87891 Personal history of nicotine dependence: Secondary | ICD-10-CM

## 2020-02-07 ENCOUNTER — Encounter: Payer: Self-pay | Admitting: *Deleted

## 2020-02-10 ENCOUNTER — Other Ambulatory Visit: Payer: Self-pay | Admitting: Internal Medicine

## 2020-02-10 ENCOUNTER — Other Ambulatory Visit (HOSPITAL_COMMUNITY): Payer: Self-pay | Admitting: Internal Medicine

## 2020-02-10 ENCOUNTER — Other Ambulatory Visit: Payer: Self-pay

## 2020-02-10 ENCOUNTER — Ambulatory Visit
Admission: RE | Admit: 2020-02-10 | Discharge: 2020-02-10 | Disposition: A | Discharge status: Home / Self Care | Payer: Medicare HMO | Source: Ambulatory Visit | Attending: Internal Medicine | Admitting: Internal Medicine

## 2020-02-10 DIAGNOSIS — R3129 Other microscopic hematuria: Secondary | ICD-10-CM | POA: Insufficient documentation

## 2020-02-10 DIAGNOSIS — R300 Dysuria: Secondary | ICD-10-CM | POA: Diagnosis present

## 2020-02-29 ENCOUNTER — Ambulatory Visit
Admission: RE | Admit: 2020-02-29 | Discharge: 2020-02-29 | Disposition: A | Discharge status: Home / Self Care | Payer: Medicare HMO | Source: Ambulatory Visit | Attending: Family Medicine | Admitting: Family Medicine

## 2020-02-29 ENCOUNTER — Other Ambulatory Visit: Payer: Self-pay

## 2020-02-29 DIAGNOSIS — Z1231 Encounter for screening mammogram for malignant neoplasm of breast: Secondary | ICD-10-CM | POA: Insufficient documentation

## 2020-09-26 ENCOUNTER — Ambulatory Visit: Payer: Medicare HMO | Attending: Neurology | Admitting: Physical Therapy

## 2020-09-26 ENCOUNTER — Other Ambulatory Visit: Payer: Self-pay

## 2020-09-26 ENCOUNTER — Encounter: Payer: Self-pay | Admitting: Physical Therapy

## 2020-09-26 DIAGNOSIS — R2681 Unsteadiness on feet: Secondary | ICD-10-CM

## 2020-09-26 DIAGNOSIS — M545 Low back pain, unspecified: Secondary | ICD-10-CM | POA: Insufficient documentation

## 2020-09-26 DIAGNOSIS — M6281 Muscle weakness (generalized): Secondary | ICD-10-CM | POA: Diagnosis present

## 2020-09-26 DIAGNOSIS — R262 Difficulty in walking, not elsewhere classified: Secondary | ICD-10-CM | POA: Diagnosis present

## 2020-09-26 DIAGNOSIS — M25551 Pain in right hip: Secondary | ICD-10-CM

## 2020-09-26 DIAGNOSIS — G8929 Other chronic pain: Secondary | ICD-10-CM | POA: Diagnosis present

## 2020-09-26 NOTE — Therapy (Signed)
[place] MAIN [facility] SERVICES [address] [place], [place], [address] Phone: [phone]   Fax:  [phone]  Physical Therapy Evaluation  Patient Details  Name: Angela Suarez MRN: [id number] Date of Birth: 03/07/1951 Referring Provider (PT): Dr. Manuella Ghazi   Encounter Date: 09/26/2020   PT End of Session - 09/26/20 1407     Visit Number 1    Number of Visits 13    Date for PT Re-Evaluation 12/19/20    PT Start Time 1102    PT Stop Time 1210    PT Time Calculation (min) 68 min    Equipment Utilized During Treatment Gait belt    Activity Tolerance Patient tolerated treatment well    Behavior During Therapy [facility] for tasks assessed/performed             Past Medical History:  Diagnosis Date   Allergic rhinitis    Anxiety    Arthritis    Cancer ([place])    skin   Cervical disc disease    COPD (chronic obstructive pulmonary disease) ([place])    Depression    GERD (gastroesophageal reflux disease)    Hypertension    IBS (irritable bowel syndrome)    Lumbar disc disease    Lupus anticoagulant positive    Multiple sclerosis ([place])    Stroke ([place])    06/2012   Thoracic disc disease     Past Surgical History:  Procedure Laterality Date   ABDOMINAL HYSTERECTOMY     BREAST EXCISIONAL BIOPSY Right 1990   x 2, neg   colon polyps     colonoscipy     COLONOSCOPY WITH PROPOFOL N/A 09/15/2016   Procedure: COLONOSCOPY WITH PROPOFOL;  Surgeon: Manya Silvas, MD;  Location: [facility] ENDOSCOPY;  Service: Endoscopy;  Laterality: N/A;   POLYPECTOMY Right 1990   throat polyp removed by Dr Ladene Artist.    TONSILLECTOMY      There were no vitals filed for this visit.    Subjective Assessment - 09/26/20 1110     Subjective "You're not going to believe this, but its all true"; Patient reports chronic pain over last 3 years in back/hips/knees; She reports mild deficits from MS but is primarily limited in mobility because of back/hip and knee pain     Pertinent History 70 yo Female with MS (Diagnosed 19 years ago). 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 She plans on following up with Dr. Rudene Christians.  She presents to therapy with flexed, slumped posture and ambulates with significant sway/imbalance. She presents to therapy without AD. She reports she has a walker that's in her trunk and will use it in the community sometimes; She will hold onto husband's arm when he is with her. She does not use any assistive device in the house. She will furniture walk. She reports she used to do Yoga but hasn't done it in several years because of COVID and recent passing of her mother.Despite unsteadiness, patient denies any falls in last 6 months. Does report mild numbness/tingling in BUE hands; PMH significant for mild ischemic stroke in 2014 with numbness on left side of face; Patient reports being active, she is not currently doing  any exercise/HEP at this time; Is experiencing some trouble with bladder control with no urge felt but then leaking upon standing;    Limitations Standing;Walking;Sitting    How long can you sit comfortably? 30 min with appropriate chair/choice of sitting    How long can you stand comfortably? 1 hour with standing rest breaks/repositioning    How long can you walk comfortably? approximately 300 feet    Diagnostic tests Recent MRI shows no MS progression with no new lesions;    Patient Stated Goals Restore core strength, alleviate back pain, improve balance/walking;    Currently in Pain? Yes    Pain Score 6     Pain Location Back    Pain Orientation Mid;Lower    Pain Descriptors / Indicators Aching;Throbbing    Pain Type Chronic pain    Pain  Radiating Towards radiates along lower back;    Pain Onset More than a month ago    Pain Frequency Constant    Aggravating Factors  too much activity, prolonged sitting; prolonged walking    Pain Relieving Factors lying down    Effect of Pain on Daily Activities decreased activity tolerance;    Multiple Pain Sites Yes    Pain Score 2    Pain Location Hip    Pain Orientation Right    Pain Descriptors / Indicators Sharp    Pain Type Chronic pain    Pain Onset More than a month ago    Pain Frequency Intermittent    Aggravating Factors  worse with prolonged standing/walking/ sitting    Pain Relieving Factors lying down or standing up straight    Effect of Pain on Daily Activities decreased activity tolerance;                [facility] PT Assessment - 09/26/20 0001       Assessment   Medical Diagnosis MS    Referring Provider (PT) Dr. Manuella Ghazi    Onset Date/Surgical Date 11/05/13    Hand Dominance Right    Next MD Visit unkonwn    Prior Therapy had outpatient PT in 2017 with good results; has not done PT right now;      Precautions   Precautions Fall    Required Braces or Orthoses --   none     Restrictions   Weight Bearing Restrictions No      Balance Screen   Has the patient fallen in the past 6 months No    Has the patient had a decrease in activity level because of a fear of falling?  No    Is the patient reluctant to leave their home because of a fear of falling?  No      Home Environment   Living Environment Private residence    Living Arrangements Spouse/significant other    Type of [place] to enter    Entrance Stairs-Number of Steps 3    Entrance Stairs-Rails Can reach both    [place] One level    [place] bars - toilet;Cane - single point;Toilet riser      Prior Function   Level of Independence Independent    Vocation Retired    Leisure read, shop, visit with friends, socialize, farm living      Cognition   Overall  Cognitive Status Within Functional Limits for tasks assessed      Observation/Other Assessments   Observations very nice; does shift positions in sitting due to pain;  Scoliosis does exhibit thoracolumbar scoliosis curve with right scapular winging and left hip higher than right hip;    Focus on Therapeutic Outcomes (FOTO)  53%      Sensation   Light Touch Appears Intact   has some numbness in BUE hands but not in BLE legs     Coordination   Gross Motor Movements are Fluid and Coordinated Yes    Fine Motor Movements are Fluid and Coordinated Yes    Finger Nose Finger Test accurate bilaterally      Posture/Postural Control   Posture Comments in standing, exhibits thoracic kyphosis and forward flexed posture; has scoliotic "S" curve with left hip higher than right, does not wear inserts      Tone   Assessment Location --   exhibits normal tone in BUE and BLE     ROM / Strength   AROM / PROM / Strength AROM;Strength      AROM   Overall AROM Comments BUE and BLE are [facility]      Strength   Strength Assessment Site Hip;Knee;Ankle    Right/Left Hip Left;Right    Right Hip Flexion 4-/5    Right Hip ABduction 3/5    Right Hip ADduction 3/5    Left Hip Flexion 4/5    Left Hip ABduction 3/5    Left Hip ADduction 3/5    Right/Left Knee Right;Left    Right Knee Flexion 4/5    Right Knee Extension 4/5    Left Knee Flexion 4/5    Left Knee Extension 4/5    Right/Left Ankle Right;Left    Right Ankle Dorsiflexion 4/5    Left Ankle Dorsiflexion 4/5      Palpation   Palpation comment mild tenderness to right lateral hip, does require significant pressure to feel pain      Special Tests    Special Tests Hip Special Tests    Hip Special Tests  Hip Scouring;Anterior Hip Impingement Test;Patrick (FABER) Test      Saralyn Pilar [facility]) Test   Findings Positive    Side Right      Hip Scouring   Findings Negative    Side Right      Anterior Hip Impingement Test    Findings Negative     Side  Right      Transfers   Comments able to transfer sit to stand without pushing on chair, does report back/knee pain      Ambulation/Gait   Gait Comments pt ambulates with reciprocal gait pattern, normal base of support, slightlys slower gait speed, forward flexed posture with increased lateral instability and unsteadiness especially when turning; no foot drag noted;      Standardized Balance Assessment   Standardized Balance Assessment Five Times Sit to Stand;10 meter walk test    Five times sit to stand comments  19.87 sec with arms across chest ( increased back pain)    10 Meter Walk comfortable pace: 0.667 m/s without AD, fast pace: 1.04 m/s (high risk for falls)      High Level Balance   High Level Balance Comments defer balance testing to next session                        Objective measurements completed on examination: See above findings.               PT Education - 09/26/20 1407     Education provided Yes    Education Details plan of  care/recommendation    Person(s) Educated Patient    Methods Explanation    Comprehension Verbalized understanding              PT Short Term Goals - 09/26/20 1438       PT SHORT TERM GOAL #1   Title Patient will be adherent to HEP at least 3x a week to improve functional strength and balance for better safety at home.    Time 4    Period Weeks    Status New    Target Date 10/24/20      PT SHORT TERM GOAL #2   Title Patient (> 62 years old) will complete five times sit to stand test in < 15 seconds indicating an increased LE strength and improved balance.    Time 4    Period Weeks    Status New    Target Date 10/24/20               PT Long Term Goals - 09/26/20 1441       PT LONG TERM GOAL #1   Title Patient will improve FOTO score to >54% to indicate improved functional mobility with ADLs.    Time 12    Period Weeks    Status New    Target Date 12/19/20      PT LONG TERM GOAL #2    Title Patient will increase 10 meter walk test to >1.8m/s as to improve gait speed for better community ambulation and to reduce fall risk.    Time 12    Period Weeks    Status New    Target Date 12/19/20      PT LONG TERM GOAL #3   Title Patient will increase six minute walk test distance to >1000 for progression to community ambulator and improve gait ability    Time 12    Period Weeks    Status New    Target Date 12/19/20      PT LONG TERM GOAL #4   Title Patient will demonstrate an improved Berg Balance Score of > 48/56 as to demonstrate improved balance with ADLs such as sitting/standing and transfer balance and reduced fall risk.    Time 12    Period Weeks    Status New    Target Date 12/19/20      PT LONG TERM GOAL #5   Title Patient will report a worst pain of 5/10 on VAS in    back/hip         to improve tolerance with ADLs and reduced symptoms with activities.    Time 12    Period Weeks    Status New    Target Date 12/19/20                    Plan - 09/26/20 1408     Clinical Impression Statement Patient is a 70 yo Female who has chronic back/hip and knee pain which limits overall mobility. In addition to pain, she has MS and exhibits weakness and imbalance. Patient has multiple bulging discs in thoracic and lumbar spine. She also has scoliosis and exhibits thoracic kyphosis and forward flexed posture in standing. Patient has normal tone in BUE and BLE and exhibits good coordination. She does exhibit increased weakness in BLE which is prominent in hips. This could be limited because of MS or could be limited from chronic pain. Patient denies any recent falls. She ambulates without AD at evaluation however exhibits increased unsteadiness  when walking unsupported especially when turning. Patient does test as a high risk for falls as evidenced by 5 times sit<>Stand and 10 meter walk. Plan to further assess balance next session. She would benefit from skilled PT  Intervention to improve strength, balance and mobility;    Personal Factors and Comorbidities Age;Comorbidity 3+;Past/Current Experience;Time since onset of injury/illness/exacerbation    Comorbidities chronic pain with OA in back/hip and knee, MS, Past CVA 2014, anxiety/depression, high risk for falls    Examination-Activity Limitations Locomotion Level;Squat;Stairs;Stand;Transfers    Examination-Participation Restrictions Cleaning;Community Activity;Shop;Volunteer;Yard Work    [employer] Evolving/Moderate complexity    Clinical Decision Making Moderate    Rehab Potential Fair    PT Frequency 1x / week    PT Duration 12 weeks    PT Treatment/Interventions Therapeutic exercise;Balance training;Neuromuscular re-education;Patient/family education;Cryotherapy;Electrical Stimulation;Moist Heat;Gait training;Stair training;Functional mobility training;Therapeutic activities;Manual techniques;Energy conservation    PT Next Visit Plan further assess balance- BERG/FGA; work on gentle strengthening while monitoring hip/back pain    PT Home Exercise Plan will address next session;    Consulted and Agree with Plan of Care Patient             Patient will benefit from skilled therapeutic intervention in order to improve the following deficits and impairments:  Decreased strength, Decreased balance, Pain, Decreased activity tolerance, Decreased endurance, Difficulty walking, Decreased mobility, Postural dysfunction, Impaired flexibility  Visit Diagnosis: Muscle weakness (generalized)  Chronic low back pain, unspecified back pain laterality, unspecified whether sciatica present  Pain in right hip  Difficulty in walking, not elsewhere classified  Unsteadiness on feet     Problem List There are no problems to display for this patient.   Kenecia Barren PT, DPT 09/26/2020, 2:54 PM  [place] MAIN [facility] SERVICES [address]  [place], [place], [address] Phone: [phone]   Fax:  [phone]  Name: Angela Suarez MRN: [id number] Date of Birth: [birth year]

## 2020-10-03 ENCOUNTER — Ambulatory Visit: Payer: Medicare HMO | Admitting: Physical Therapy

## 2020-10-04 ENCOUNTER — Ambulatory Visit: Payer: Medicare HMO

## 2020-10-04 ENCOUNTER — Other Ambulatory Visit: Payer: Self-pay

## 2020-10-04 DIAGNOSIS — R262 Difficulty in walking, not elsewhere classified: Secondary | ICD-10-CM

## 2020-10-04 DIAGNOSIS — M545 Low back pain, unspecified: Secondary | ICD-10-CM

## 2020-10-04 DIAGNOSIS — M25551 Pain in right hip: Secondary | ICD-10-CM

## 2020-10-04 DIAGNOSIS — M6281 Muscle weakness (generalized): Secondary | ICD-10-CM

## 2020-10-04 DIAGNOSIS — R2681 Unsteadiness on feet: Secondary | ICD-10-CM

## 2020-10-04 NOTE — Therapy (Signed)
[place] MAIN [facility] SERVICES [address] [place], [place], [address] Phone: [phone]   Fax:  [phone]  Physical Therapy Treatment  Patient Details  Name: Angela Suarez MRN: [id number] Date of Birth: [birth year] Referring Provider (PT): Dr. Manuella Ghazi   Encounter Date: 10/04/2020   PT End of Session - 10/04/20 1500     Visit Number 2    Number of Visits 13    Date for PT Re-Evaluation 12/19/20    Authorization Type [employer] MCR- no visit limits    Authorization Time Period Cert 4/76/54-6/50/35    PT Start Time 1347    PT Stop Time 1435    PT Time Calculation (min) 48 min    Equipment Utilized During Treatment Gait belt    Activity Tolerance Patient tolerated treatment well    Behavior During Therapy [facility] for tasks assessed/performed             Past Medical History:  Diagnosis Date   Allergic rhinitis    Anxiety    Arthritis    Cancer ([place])    skin   Cervical disc disease    COPD (chronic obstructive pulmonary disease) ([place])    Depression    GERD (gastroesophageal reflux disease)    Hypertension    IBS (irritable bowel syndrome)    Lumbar disc disease    Lupus anticoagulant positive    Multiple sclerosis ([place])    Stroke ([place])    06/2012   Thoracic disc disease     Past Surgical History:  Procedure Laterality Date   ABDOMINAL HYSTERECTOMY     BREAST EXCISIONAL BIOPSY Right 1990   x 2, neg   colon polyps     colonoscipy     COLONOSCOPY WITH PROPOFOL N/A 09/15/2016   Procedure: COLONOSCOPY WITH PROPOFOL;  Surgeon: Manya Silvas, MD;  Location: [facility] ENDOSCOPY;  Service: Endoscopy;  Laterality: N/A;   POLYPECTOMY Right 1990   throat polyp removed by Dr Ladene Artist.    TONSILLECTOMY      There were no vitals filed for this visit.   Subjective Assessment - 10/04/20 1354     Subjective Pt reports she is doing better today after seeing her chiropractor thre preceeding three days this week, her back now in much better  condition. She has begun to incorporate 3 yoga poses of yore after asking her chiropractor for clearance on these.    Pertinent History 70 yo Female with MS (Diagnosed 19 years ago). 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 She plans on following up with Dr. Rudene Christians.  She presents to therapy with flexed, slumped posture and ambulates with significant sway/imbalance. She presents to therapy without AD. She reports she has a walker that's in her trunk and will use it in the community sometimes; She will hold onto husband's arm when he is with her. She does not use any assistive device in the house. She will furniture walk. She reports she used to do Yoga but hasn't done it in several years because of COVID and recent passing of her mother.Despite unsteadiness, patient denies any falls in last 6 months. Does report mild numbness/tingling in BUE hands; PMH significant for  mild ischemic stroke in 2014 with numbness on left side of face; Patient reports being active, she is not currently doing any exercise/HEP at this time; Is experiencing some trouble with bladder control with no urge felt but then leaking upon standing;    Currently in Pain? Yes    Pain Score 3     Pain Location --   Central to lower T spine area T9ish; Left lateral lumbopelvic region.               [facility] PT Assessment - 10/04/20 0001       Balance   Balance Assessed Yes      Standardized Balance Assessment   Standardized Balance Assessment Berg Balance Test      Berg Balance Test   Sit to Stand Able to stand without using hands and stabilize independently    Standing Unsupported Able to stand safely 2 minutes    Sitting with Back  Unsupported but Feet Supported on Floor or Stool Able to sit safely and securely 2 minutes    Stand to Sit Sits safely with minimal use of hands    Transfers Able to transfer safely, minor use of hands    Standing Unsupported with Eyes Closed Able to stand 10 seconds safely    Standing Unsupported with Feet Together Able to place feet together independently and stand for 1 minute with supervision    From Standing, Reach Forward with Outstretched Arm Can reach confidently >25 cm (10")    From Standing Position, Pick up Object from Floor Able to pick up shoe safely and easily    From Standing Position, Turn to Look Behind Over each Shoulder Looks behind one side only/other side shows less weight shift   LOB with return from Right   Turn 360 Degrees Able to turn 360 degrees safely one side only in 4 seconds or less    Standing Unsupported, Alternately Place Feet on Step/Stool Able to stand independently and complete 8 steps >20 seconds    Standing Unsupported, One Foot in Front Able to take small step independently and hold 30 seconds    Standing on One Leg Able to lift leg independently and hold equal to or more than 3 seconds    Total Score 48      Functional Gait  Assessment   Gait assessed  Yes    Gait Level Surface Walks 20 ft, slow speed, abnormal gait pattern, evidence for imbalance or deviates 10-15 in outside of the 12 in walkway width. Requires more than 7 sec to ambulate 20 ft.    Change in Gait Speed Able to change speed, demonstrates mild gait deviations, deviates 6-10 in outside of the 12 in walkway width, or no gait deviations, unable to achieve a major change in velocity, or uses a change in velocity, or uses an assistive device.    Gait with Horizontal Head Turns Performs head turns with moderate changes in gait velocity, slows down, deviates 10-15 in outside 12 in walkway width but recovers, can continue to walk.    Gait with Vertical Head Turns Performs task with moderate change in  gait velocity, slows down, deviates 10-15 in outside 12 in walkway width but recovers, can continue to walk.    Gait and Pivot Turn Pivot turns safely within 3 sec and stops quickly with no loss of balance.    Step Over Obstacle Is able to step over one shoe box (4.5 in total height) but must slow down and adjust  steps to clear box safely. May require verbal cueing.    Gait with Narrow Base of Support Ambulates less than 4 steps heel to toe or cannot perform without assistance.    Gait with Eyes Closed Walks 20 ft, slow speed, abnormal gait pattern, evidence for imbalance, deviates 10-15 in outside 12 in walkway width. Requires more than 9 sec to ambulate 20 ft.    Ambulating Backwards Cannot walk 20 ft without assistance, severe gait deviations or imbalance, deviates greater than 15 in outside 12 in walkway width or will not attempt task.    Steps Alternating feet, must use rail.    Total Score 12                           [facility] Adult PT Treatment/Exercise - 10/04/20 0001       Exercises   Exercises Knee/Hip      Knee/Hip Exercises: Seated   Long Arc Quad AROM;Both;1 set;10 reps    Long Arc Quad Weight 0 lbs.    Long Arc [employer] Limitations for [employer] AROM;Both;1 set;20 reps    Marching Limitations core stabilization, elevated seat height   HEP assignment   Sit to Sand 1 set;15 reps;without UE support   Chair in front for PRN support; 21" height, emphasis on balance during activity           HEP handout           PT Short Term Goals - 09/26/20 1438       PT SHORT TERM GOAL #1   Title Patient will be adherent to HEP at least 3x a week to improve functional strength and balance for better safety at home.    Time 4    Period Weeks    Status New    Target Date 10/24/20      PT SHORT TERM GOAL #2   Title Patient (> 40 years old) will complete five times sit to stand test in < 15 seconds indicating an increased LE strength and improved balance.     Time 4    Period Weeks    Status New    Target Date 10/24/20               PT Long Term Goals - 10/04/20 1514       PT LONG TERM GOAL #1   Title Patient will improve FOTO score to >54% to indicate improved functional mobility with ADLs.    Time 12    Period Weeks    Status On-going    Target Date 12/19/20      PT LONG TERM GOAL #2   Title Patient will increase 10 meter walk test to >1.50m/s as to improve gait speed for better community ambulation and to reduce fall risk.    Time 12    Period Weeks    Status On-going    Target Date 12/19/20      PT LONG TERM GOAL #3   Title Patient will increase six minute walk test distance to >1000 for progression to community ambulator and improve gait ability    Time 12    Period Weeks    Status On-going    Target Date 12/19/20      PT LONG TERM GOAL #4   Title Patient will demonstrate Berg Balance Test > 51/56 and FGA score >22/30 to demonstrate improved balance for ADL/IADL and reduce falls risk.  Baseline On visit 2: BBT: 48/56, FGA 11/30;    Time 12    Period Weeks    Status Revised    Target Date 12/19/20      PT LONG TERM GOAL #5   Title Patient will report a worst pain of 5/10 on VAS in    back/hip         to improve tolerance with ADLs and reduced symptoms with activities.    Time 12    Period Weeks    Status On-going    Target Date 12/19/20                   Plan - 10/04/20 1507     Clinical Impression Statement Pt returning to clinic for visit 2, pain much improved since last week. Additional balance outcome measures are completed this date, the [facility] Test showing some ceiling effect at 48/56, the FGA better illustrating the degree of unsteadiness seen in session. Session concluded with trial of exercises for HEP assignment, simple focus on moderate activation of basic large muscle groups in legs and core, none of which exacerbate pain to a concerning degree to the patient. Pt will conitnue to  benefit from skilled PT services to address impairment in strength, balance, and gross motor control, in order to maximize safety and independence in ADL, IADL, and to reduce falls risk.    Personal Factors and Comorbidities Age;Comorbidity 3+;Past/Current Experience;Time since onset of injury/illness/exacerbation    Comorbidities chronic pain with OA in back/hip and knee, MS, Past CVA 2014, anxiety/depression, high risk for falls    Examination-Activity Limitations Locomotion Level;Squat;Stairs;Stand;Transfers    Examination-Participation Restrictions Cleaning;Community Activity;Shop;Volunteer;Yard Work    [employer] Evolving/Moderate complexity    Clinical Decision Making Moderate    Rehab Potential Fair    PT Frequency 1x / week    PT Duration 12 weeks    PT Treatment/Interventions Therapeutic exercise;Balance training;Neuromuscular re-education;Patient/family education;Cryotherapy;Electrical Stimulation;Moist Heat;Gait training;Stair training;Functional mobility training;Therapeutic activities;Manual techniques;Energy conservation    PT Next Visit Plan Gain feedback from HEP; expand upon HEP to include targeted exercises for hip abductors, ankle DF, and balance deficits.    PT Home Exercise Plan In stage 1, pending further development    Consulted and Agree with Plan of Care Patient             Patient will benefit from skilled therapeutic intervention in order to improve the following deficits and impairments:  Decreased strength, Decreased balance, Pain, Decreased activity tolerance, Decreased endurance, Difficulty walking, Decreased mobility, Postural dysfunction, Impaired flexibility  Visit Diagnosis: Muscle weakness (generalized)  Chronic low back pain, unspecified back pain laterality, unspecified whether sciatica present  Pain in right hip  Difficulty in walking, not elsewhere classified  Unsteadiness on feet     Problem List There are no  problems to display for this patient.  3:25 PM, 10/04/20 Etta Grandchild, PT, DPT Physical Therapist - [address] [phone]     Etta Grandchild 10/04/2020, 3:23 PM  [place] MAIN [facility] SERVICES [address] [place], [place], [address] Phone: [phone]   Fax:  [phone]  Name: Angela Suarez MRN: [id number] Date of Birth: 08-May-1950

## 2020-10-09 ENCOUNTER — Encounter: Payer: Self-pay | Admitting: Physical Therapy

## 2020-10-09 ENCOUNTER — Ambulatory Visit: Payer: Medicare HMO | Admitting: Physical Therapy

## 2020-10-09 DIAGNOSIS — M25551 Pain in right hip: Secondary | ICD-10-CM

## 2020-10-09 DIAGNOSIS — M6281 Muscle weakness (generalized): Secondary | ICD-10-CM

## 2020-10-09 DIAGNOSIS — M545 Low back pain, unspecified: Secondary | ICD-10-CM

## 2020-10-09 DIAGNOSIS — R2681 Unsteadiness on feet: Secondary | ICD-10-CM

## 2020-10-09 DIAGNOSIS — R262 Difficulty in walking, not elsewhere classified: Secondary | ICD-10-CM

## 2020-10-09 NOTE — Therapy (Signed)
[place] MAIN [facility] SERVICES [address] [place], [place], [address] Phone: [phone]   Fax:  [phone]  October 09, 2020   No Recipients  Physical Therapy Discharge Summary  Patient: Angela Suarez  MRN: [id number]  Date of Birth: 12/21/1950   Diagnosis: Muscle weakness (generalized)  Chronic low back pain, unspecified back pain laterality, unspecified whether sciatica present  Pain in right hip  Difficulty in walking, not elsewhere classified  Unsteadiness on feet Referring Provider (PT): Dr. Manuella Ghazi   The above patient had been seen in Physical Therapy 2 times of 13 treatments scheduled with 0 no shows and 11 cancellations.  The treatment consisted of balance assessment/HEP The patient is: Worse  Subjective: Patient called and cancelled all remaining appointments stating her back was worse and she wouldn't be able to continue with therapy;   Discharge Findings: patient only attended 1 follow up visit, no objective measures obtained;   Functional Status at Discharge: see evaluation  No Goals Met    Sincerely,   Kacie Huxtable, PT DPT   CC No Recipients  [place] MAIN [facility] SERVICES [address] [place], [place], [address] Phone: [phone]   Fax:  [phone]  Patient: Angela Suarez  MRN: [id number]  Date of Birth: [birth year]

## 2020-10-10 ENCOUNTER — Ambulatory Visit: Payer: Medicare HMO | Admitting: Physical Therapy

## 2020-10-17 ENCOUNTER — Ambulatory Visit: Payer: Medicare HMO | Admitting: Physical Therapy

## 2020-10-22 ENCOUNTER — Ambulatory Visit: Payer: Medicare HMO | Admitting: Physical Therapy

## 2020-10-24 ENCOUNTER — Ambulatory Visit: Payer: Medicare HMO | Admitting: Physical Therapy

## 2020-10-30 ENCOUNTER — Ambulatory Visit: Payer: Medicare HMO | Admitting: Physical Therapy

## 2020-10-31 ENCOUNTER — Ambulatory Visit: Payer: Medicare HMO | Admitting: Physical Therapy

## 2020-11-05 ENCOUNTER — Ambulatory Visit: Payer: Medicare HMO | Admitting: Physical Therapy

## 2020-11-07 ENCOUNTER — Ambulatory Visit: Payer: Medicare HMO | Admitting: Physical Therapy

## 2020-11-14 ENCOUNTER — Ambulatory Visit: Payer: Medicare HMO | Admitting: Physical Therapy

## 2020-11-21 ENCOUNTER — Ambulatory Visit: Payer: Medicare HMO | Admitting: Physical Therapy

## 2020-11-25 ENCOUNTER — Emergency Department: Payer: Medicare HMO

## 2020-11-25 ENCOUNTER — Other Ambulatory Visit: Payer: Self-pay

## 2020-11-25 ENCOUNTER — Emergency Department
Admission: EM | Admit: 2020-11-25 | Discharge: 2020-11-25 | Disposition: A | Discharge status: Home / Self Care | Payer: Medicare HMO | Attending: Emergency Medicine | Admitting: Emergency Medicine

## 2020-11-25 DIAGNOSIS — S0990XA Unspecified injury of head, initial encounter: Secondary | ICD-10-CM | POA: Diagnosis not present

## 2020-11-25 DIAGNOSIS — Z85828 Personal history of other malignant neoplasm of skin: Secondary | ICD-10-CM | POA: Diagnosis not present

## 2020-11-25 DIAGNOSIS — I10 Essential (primary) hypertension: Secondary | ICD-10-CM | POA: Diagnosis not present

## 2020-11-25 DIAGNOSIS — Z7982 Long term (current) use of aspirin: Secondary | ICD-10-CM | POA: Diagnosis not present

## 2020-11-25 DIAGNOSIS — J449 Chronic obstructive pulmonary disease, unspecified: Secondary | ICD-10-CM | POA: Diagnosis not present

## 2020-11-25 DIAGNOSIS — S42201A Unspecified fracture of upper end of right humerus, initial encounter for closed fracture: Secondary | ICD-10-CM | POA: Diagnosis not present

## 2020-11-25 DIAGNOSIS — W108XXA Fall (on) (from) other stairs and steps, initial encounter: Secondary | ICD-10-CM | POA: Diagnosis not present

## 2020-11-25 DIAGNOSIS — R52 Pain, unspecified: Secondary | ICD-10-CM

## 2020-11-25 DIAGNOSIS — Z79899 Other long term (current) drug therapy: Secondary | ICD-10-CM | POA: Insufficient documentation

## 2020-11-25 DIAGNOSIS — Y92009 Unspecified place in unspecified non-institutional (private) residence as the place of occurrence of the external cause: Secondary | ICD-10-CM | POA: Diagnosis not present

## 2020-11-25 DIAGNOSIS — S4992XA Unspecified injury of left shoulder and upper arm, initial encounter: Secondary | ICD-10-CM | POA: Diagnosis present

## 2020-11-25 DIAGNOSIS — S4292XA Fracture of left shoulder girdle, part unspecified, initial encounter for closed fracture: Secondary | ICD-10-CM

## 2020-11-25 MED ORDER — PROPOFOL 10 MG/ML IV BOLUS
1.0000 mg/kg | Freq: Once | INTRAVENOUS | Status: AC
  Administered 2020-11-25: 63.5 mg via INTRAVENOUS
  Filled 2020-11-25: qty 20

## 2020-11-25 MED ORDER — HYDROCODONE-ACETAMINOPHEN 5-325 MG PO TABS: 2.0000 | ORAL_TABLET | Freq: Four times a day (QID) | ORAL | 0 refills | Status: AC | PRN

## 2020-11-25 MED ORDER — ONDANSETRON 4 MG PO TBDP
4.0000 mg | ORAL_TABLET | Freq: Once | ORAL | Status: AC
  Administered 2020-11-25: 4 mg via ORAL
  Filled 2020-11-25: qty 1

## 2020-11-25 MED ORDER — LACTATED RINGERS IV BOLUS
1000.0000 mL | Freq: Once | INTRAVENOUS | Status: AC
  Administered 2020-11-25: 1000 mL via INTRAVENOUS

## 2020-11-25 MED ORDER — ONDANSETRON HCL 4 MG/2ML IJ SOLN
4.0000 mg | Freq: Once | INTRAMUSCULAR | Status: AC
  Administered 2020-11-25: 4 mg via INTRAVENOUS
  Filled 2020-11-25: qty 2

## 2020-11-25 MED ORDER — FENTANYL CITRATE (PF) 100 MCG/2ML IJ SOLN
50.0000 ug | Freq: Once | INTRAMUSCULAR | Status: AC
Start: 2020-11-25 — End: 2020-11-25
  Administered 2020-11-25: 50 ug via INTRAVENOUS
  Filled 2020-11-25: qty 2

## 2020-11-25 MED ORDER — ONDANSETRON HCL 4 MG PO TABS: 4.0000 mg | ORAL_TABLET | Freq: Three times a day (TID) | ORAL | 0 refills | Status: DC | PRN

## 2020-11-25 MED ORDER — HYDROCODONE-ACETAMINOPHEN 5-325 MG PO TABS
1.0000 | ORAL_TABLET | Freq: Once | ORAL | Status: AC
  Administered 2020-11-25: 1 via ORAL
  Filled 2020-11-25: qty 1

## 2020-11-25 NOTE — ED Triage Notes (Signed)
Patient arrived by EMS from home for fall with injury to left shoulder. C/o nausea. Tripped over dog. Reports hitting right temple. HX stroke. Does not take blood thinners. Takes daily aspirin.

## 2020-11-25 NOTE — ED Triage Notes (Signed)
Pt presents to the ED via EMS after a mechanical fall, pt states she fell down 2 steps and tripped over her dog. Pt states she did hit the R side of her head. Denies LOC. Denies blood thinner. Pt states that her L arm from shoulder to elbow is hurting her. Pt is A&Ox4 and NAD.

## 2020-11-25 NOTE — ED Provider Notes (Signed)
[facility] Emergency Department Provider Note  ____________________________________________   Event Date/Time   First MD Initiated Contact with Patient 11/25/20 1744     (approximate)  I have reviewed the triage vital signs and the nursing notes.   HISTORY  Chief Complaint Fall   HPI Angela Suarez is a 70 y.o. 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  Patient states he tripped over her dog and fell on her left shoulder and also hit the back of her head.  She states she has pain from the shoulder down to the elbow and into the wrist although she is not sure if she hit her wrist or not.  She did not have LOC and is not currently on any blood thinners aside from a daily ASA.  He has been experiencing some nausea since her injury.  However she denies any other recent sick symptoms including fevers, chills, vomiting, chest pain, cough, shortness of breath, abdominal pain, diarrhea, urinary symptoms, rash or other recent falls.  She endorses chronic pain in her back and other extremities but no other acute pain today.         Past Medical History:  Diagnosis Date   Allergic rhinitis    Anxiety    Arthritis    Cancer ([place])    skin   Cervical disc disease    COPD (chronic obstructive pulmonary disease) (HCC)    Depression    GERD (gastroesophageal reflux disease)    Hypertension    IBS (irritable bowel syndrome)    Lumbar disc disease    Lupus anticoagulant positive    Multiple sclerosis ([place])    Stroke ([place])    06/2012   Thoracic disc disease     There are no problems to display for this patient.   Past Surgical History:  Procedure Laterality Date   ABDOMINAL HYSTERECTOMY     BREAST EXCISIONAL BIOPSY Right 1990   x 2, neg   colon polyps     colonoscipy     COLONOSCOPY WITH PROPOFOL N/A 09/15/2016    Procedure: COLONOSCOPY WITH PROPOFOL;  Surgeon: Manya Silvas, MD;  Location: [facility] ENDOSCOPY;  Service: Endoscopy;  Laterality: N/A;   POLYPECTOMY Right 1990   throat polyp removed by Dr Ladene Artist.    TONSILLECTOMY      Prior to Admission medications   Medication Sig Start Date End Date Taking? Authorizing Provider  HYDROcodone-acetaminophen (NORCO) 5-325 MG tablet Take 2 tablets by mouth every 6 (six) hours as needed for up to 3 days for severe pain. 11/25/20 11/28/20 Yes Lucrezia Starch, MD  ondansetron (ZOFRAN) 4 MG tablet Take 1 tablet (4 mg total) by mouth every 8 (eight) hours as needed for up to 10 doses for nausea or vomiting. 11/25/20  Yes Lucrezia Starch, MD  Albuterol Sulfate 108 (90 Base) MCG/ACT AEPB Inhale into the lungs every 6 (six) hours.    [provider]  aspirin 325 MG tablet Take 325 mg by mouth daily.    [provider]  atorvastatin (LIPITOR) 10 MG tablet Take 10 mg by mouth daily.    [provider]  clonazePAM (KLONOPIN) 1 MG tablet Take 1 mg by mouth 3 (three) times daily as needed.     [provider]  hydrochlorothiazide (HYDRODIURIL) 25 MG tablet Take 25 mg by mouth daily.    [provider]  Interferon Beta-1a (REBIF REBIDOSE) 44 MCG/0.5ML SOAJ Inject into the skin 3 (three) times a week.    [provider]  lidocaine (LIDODERM) 5 % Place 1 patch onto the skin daily. Patient not taking: Reported on 09/15/2016 12/22/15   Daymon Larsen, MD  losartan (COZAAR) 25 MG tablet Take 25 mg by mouth daily.    [provider]  pregabalin (LYRICA) 150 MG capsule Take 150 mg by mouth 2 (two) times daily.    [provider]  sertraline (ZOLOFT) 100 MG tablet Take 100 mg by mouth daily.    [provider]    Allergies Codeine, Duloxetine, Flagyl [metronidazole], Nsaids, Oxycodone, Prevacid [lansoprazole], Prilosec [omeprazole], Pyridium [phenazopyridine hcl], Vicodin [hydrocodone-acetaminophen],  and Lamisil [terbinafine]  Family History  Problem Relation Age of Onset   Breast cancer Mother 44   Cancer Mother    Breast cancer Paternal Aunt 53   Breast cancer Paternal Aunt 17   Cancer Father     Social History Social History   Tobacco Use   Smoking status: Every Day   Smokeless tobacco: Never  Substance Use Topics   Alcohol use: No   Drug use: No    Review of Systems  Review of Systems  Constitutional:  Negative for chills and fever.  HENT:  Negative for sore throat.   Eyes:  Negative for pain.  Respiratory:  Negative for cough and stridor.   Cardiovascular:  Negative for chest pain.  Gastrointestinal:  Negative for vomiting.  Musculoskeletal:  Positive for back pain and joint pain.  Skin:  Negative for rash.  Neurological:  Positive for headaches. Negative for seizures and loss of consciousness.  Psychiatric/Behavioral:  Negative for suicidal ideas.   All other systems reviewed and are negative.    ____________________________________________   PHYSICAL EXAM:  VITAL SIGNS: ED Triage Vitals  Enc Vitals Group     BP 11/25/20 1530 (!) 173/104     Pulse Rate 11/25/20 1530 70     Resp 11/25/20 1530 20     Temp 11/25/20 1536 97.6 F (36.4 C)     Temp Source 11/25/20 1536 Oral     SpO2 11/25/20 1530 99 %     Weight 11/25/20 1531 140 lb (63.5 kg)     Height 11/25/20 1531 '5\' 2"'$  (1.575 m)     Head Circumference --      Peak Flow --      Pain Score 11/25/20 1531 10     Pain Loc --      Pain Edu? --      Excl. in [place]? --    Vitals:   11/25/20 2000 11/25/20 2005  BP: (!) 172/96 (!) 174/88  Pulse: 85 81  Resp: 18 17  Temp:    SpO2: 100% 100%   Physical Exam Vitals and nursing note reviewed.  Constitutional:      General: She is not in acute distress.    Appearance: She is well-developed.  HENT:     Head: Normocephalic and atraumatic.     Right Ear: External ear normal.     Left Ear: External ear normal.     Nose: Nose normal.  Eyes:      Conjunctiva/sclera: Conjunctivae normal.  Cardiovascular:     Rate and Rhythm: Normal rate and regular rhythm.     Heart sounds: No murmur heard. Pulmonary:     Effort: Pulmonary effort is normal. No respiratory distress.     Breath sounds: Normal breath sounds.  Abdominal:  Palpations: Abdomen is soft.     Tenderness: There is no abdominal tenderness.  Musculoskeletal:     Cervical back: Neck supple.  Skin:    General: Skin is warm and dry.     Capillary Refill: Capillary refill takes less than 2 seconds.  Neurological:     Mental Status: She is alert and oriented to person, place, and time.  Psychiatric:        Mood and Affect: Mood normal.    Patient has deformity and significant tenderness of decreasing to motion of the left shoulder.  There are some mild tenderness over the back of the left elbow with no deformity or effusion.  There is also mild tenderness over the dorsum of the left wrist but no effusion or deformity.  There is no snuffbox tenderness.  Patient has sensation intact in the distribution of the radial ulnar and median nerves in the left upper extremity.  She has symmetric grip strength compared to the right hand.  Right upper extremity has full strength and sensation.  2+ radial pulses.  Some mild tenderness over the L and T-spine but no significant tenderness over the C-spine.  Cranial nerves II through XII grossly intact. ____________________________________________   LABS (all labs ordered are listed, but only abnormal results are displayed)  Labs Reviewed - No data to display ____________________________________________  EKG ____________________________________________  RADIOLOGY  ED MD interpretation: CT head shows no acute skull fracture or intracranial hemorrhage or other acute abnormality.  There is mild chronic microvascular ischemic changes noted.  CT C-spine is unremarkable for acute injury.  Film of the left shoulder shows anterior inferior  dislocation of the left humeral head and a comminuted displaced fracture of the radial tuberosity.  Plain film left humerus shows same findings without any other humeral fractures.  Plain film the left elbow is unremarkable for fracture or dislocation.  Plain film of the left wrist shows no acute fracture dislocation.  Plain film of the T and L-spine shows no acute fracture dislocation.  Post reduction plain film of the left shoulder shows interval reduction and mildly displaced previously seen greater trochanter tuberosity fracture.  Official radiology report(s): DG Thoracic Spine 2 View  Result Date: 11/25/2020 CLINICAL DATA:  Fall, back pain EXAM: THORACIC SPINE 2 VIEWS COMPARISON:  None. FINDINGS: Normal thoracic kyphosis. No acute fracture or listhesis of the thoracic spine. Vertebral body height has been preserved. There is intervertebral disc space narrowing, endplate remodeling, and vacuum disc phenomena throughout the mid and lower thoracic spine in keeping with changes of advanced degenerative disc disease. The paraspinal soft tissues are unremarkable. IMPRESSION: Advanced degenerative disc disease of the mid and lower thoracic spine. No acute fracture or listhesis. Electronically Signed   By: Fidela Salisbury M.D.   On: 11/25/2020 19:16   DG Lumbar Spine Complete  Result Date: 11/25/2020 CLINICAL DATA:  Fall, back pain EXAM: LUMBAR SPINE - COMPLETE 4+ VIEW COMPARISON:  None. FINDINGS: There is mild thoracolumbar dextroscoliosis, apex right at L1. Accentuated lumbar lordosis. Grade 2 anterolisthesis L5 upon S1. No acute fracture of the lumbar spine. Vertebral body heights have been preserved. There is intervertebral disc space narrowing, vacuum disc phenomena and endplate remodeling throughout the lumbar spine in keeping with changes of severe degenerative disc disease. Dystrophic calcifications within the posterior subcutaneous soft tissues is in keeping with fat necrosis. Vascular calcifications  are seen within the iliac vasculature. The paraspinal soft tissues are unremarkable. IMPRESSION: Diffuse severe degenerative disc disease. Superimposed grade 2 anterolisthesis  L5 upon S1. No acute fracture. Electronically Signed   By: Fidela Salisbury M.D.   On: 11/25/2020 19:14   DG Elbow 2 Views Left  Result Date: 11/25/2020 CLINICAL DATA:  Patient fell over her dog at home. Patient complains of pain in the left shoulder all the way down to her shoulder. Patient also complains of neck pain. EXAM: LEFT ELBOW - 2 VIEW COMPARISON:  None. FINDINGS: There is no evidence of fracture, dislocation, or joint effusion. There is no evidence of arthropathy or other focal bone abnormality. Soft tissues are unremarkable. IMPRESSION: Negative. Electronically Signed   By: Lajean Manes M.D.   On: 11/25/2020 17:04   DG Wrist Complete Left  Result Date: 11/25/2020 CLINICAL DATA:  Fall, left wrist pain EXAM: LEFT WRIST - COMPLETE 3+ VIEW COMPARISON:  None. FINDINGS: There is no evidence of fracture or dislocation. There is no evidence of arthropathy or other focal bone abnormality. Soft tissues are unremarkable. IMPRESSION: Negative. Electronically Signed   By: Fidela Salisbury M.D.   On: 11/25/2020 19:14   CT HEAD WO CONTRAST (5MM)  Result Date: 11/25/2020 CLINICAL DATA:  Fall.  Head/neck trauma. EXAM: CT HEAD WITHOUT CONTRAST CT CERVICAL SPINE WITHOUT CONTRAST TECHNIQUE: Multidetector CT imaging of the head and cervical spine was performed following the standard protocol without intravenous contrast. Multiplanar CT image reconstructions of the cervical spine were also generated. COMPARISON:  None. FINDINGS: CT HEAD FINDINGS Brain: No evidence of acute infarction, hemorrhage, hydrocephalus, extra-axial collection or mass lesion/mass effect. Patchy periventricular white matter hypoattenuation is noted consistent with mild chronic microvascular ischemic change. Vascular: No hyperdense vessel or unexpected calcification.  Skull: Normal. Negative for fracture or focal lesion. Sinuses/Orbits: Globes and orbits are unremarkable. Visualized sinuses are clear. Other: None. CT CERVICAL SPINE FINDINGS Alignment: Mild reversal of the normal cervical lordosis, apex at C3-C4. No spondylolisthesis/subluxation. Skull base and vertebrae: No acute fracture. No primary bone lesion or focal pathologic process. Soft tissues and spinal canal: No prevertebral fluid or swelling. No visible canal hematoma. Disc levels: Prior anterior cervical disc fusion at C5-C6. Mature bone graft material spans the disc interspace and there is a well-seated anterior fixation plate and associated screws. Moderate loss of disc height at C3-C4, C4-C5 and C6-C7. Mild disc bulging and endplate spurring at these levels. No convincing disc herniation. Upper chest: Negative. Other: None. IMPRESSION: HEAD CT 1. No acute intracranial abnormalities. 2. Mild chronic microvascular ischemic change. CERVICAL CT 1. No fracture or acute finding. Electronically Signed   By: Lajean Manes M.D.   On: 11/25/2020 17:10   CT Cervical Spine Wo Contrast  Result Date: 11/25/2020 CLINICAL DATA:  Fall.  Head/neck trauma. EXAM: CT HEAD WITHOUT CONTRAST CT CERVICAL SPINE WITHOUT CONTRAST TECHNIQUE: Multidetector CT imaging of the head and cervical spine was performed following the standard protocol without intravenous contrast. Multiplanar CT image reconstructions of the cervical spine were also generated. COMPARISON:  None. FINDINGS: CT HEAD FINDINGS Brain: No evidence of acute infarction, hemorrhage, hydrocephalus, extra-axial collection or mass lesion/mass effect. Patchy periventricular white matter hypoattenuation is noted consistent with mild chronic microvascular ischemic change. Vascular: No hyperdense vessel or unexpected calcification. Skull: Normal. Negative for fracture or focal lesion. Sinuses/Orbits: Globes and orbits are unremarkable. Visualized sinuses are clear. Other: None. CT  CERVICAL SPINE FINDINGS Alignment: Mild reversal of the normal cervical lordosis, apex at C3-C4. No spondylolisthesis/subluxation. Skull base and vertebrae: No acute fracture. No primary bone lesion or focal pathologic process. Soft tissues and spinal canal: No prevertebral fluid  or swelling. No visible canal hematoma. Disc levels: Prior anterior cervical disc fusion at C5-C6. Mature bone graft material spans the disc interspace and there is a well-seated anterior fixation plate and associated screws. Moderate loss of disc height at C3-C4, C4-C5 and C6-C7. Mild disc bulging and endplate spurring at these levels. No convincing disc herniation. Upper chest: Negative. Other: None. IMPRESSION: HEAD CT 1. No acute intracranial abnormalities. 2. Mild chronic microvascular ischemic change. CERVICAL CT 1. No fracture or acute finding. Electronically Signed   By: Lajean Manes M.D.   On: 11/25/2020 17:10   DG Shoulder Left  Result Date: 11/25/2020 CLINICAL DATA:  Patient fell over her dog at home. Patient complains of pain in the left shoulder all the way down to her shoulder. Patient also complains of neck pain. EXAM: LEFT SHOULDER - 2+ VIEW COMPARISON:  None. FINDINGS: Fracture dislocation of the left shoulder. Specifically, the humeral head has dislocated anteriorly and slightly inferiorly. There is a comminuted fracture the greater tuberosity, displaced lateral to the humeral head by 2.3 cm. No other fractures.  AC joint normally spaced and aligned. IMPRESSION: 1. Anterior, inferior dislocation of the humeral head and comminuted, displaced fracture of the greater tuberosity. Electronically Signed   By: Lajean Manes M.D.   On: 11/25/2020 17:02   DG Shoulder Left Portable  Result Date: 11/25/2020 CLINICAL DATA:  Status post reduction of the left shoulder. EXAM: LEFT SHOULDER COMPARISON:  Earlier radiograph dated 11/25/2020. FINDINGS: Interval reduction of previously seen dislocated left shoulder. The glenohumeral  alignment appears anatomical on the provided images. Mildly displaced fracture of the greater tuberosity. No new fracture. The soft tissues are unremarkable. IMPRESSION: Interval reduction of the previously seen dislocated left shoulder, now in anatomic alignment. Mildly displaced fracture of the greater tuberosity. Electronically Signed   By: Anner Crete M.D.   On: 11/25/2020 20:03   DG Humerus Left  Result Date: 11/25/2020 CLINICAL DATA:  Patient fell over her dog at home. Patient complains of pain in the left shoulder all the way down to her shoulder. Patient also complains of neck pain. EXAM: LEFT HUMERUS - 2+ VIEW COMPARISON:  None. FINDINGS: Anterior inferior dislocation of the left humeral head with a comminuted displaced fracture of the greater tuberosity, detailed under the left shoulder radiographs. No other fractures.  Elbow joint appears normally aligned. There is proximal soft tissue swelling. IMPRESSION: 1. Anterior, inferior dislocation of the left humeral head with a comminuted displaced fracture of the greater tuberosity. 2. No other humeral fractures.  Normally aligned elbow joint. Electronically Signed   By: Lajean Manes M.D.   On: 11/25/2020 17:03    ____________________________________________   PROCEDURES  Procedure(s) performed (including Critical Care):  .Sedation  Date/Time: 11/25/2020 8:52 PM Performed by: Lucrezia Starch, MD Authorized by: Lucrezia Starch, MD   Consent:    Consent obtained:  Verbal   Consent given by:  Patient   Risks discussed:  Allergic reaction, dysrhythmia, inadequate sedation, nausea, vomiting, respiratory compromise necessitating ventilatory assistance and intubation, prolonged sedation necessitating reversal and prolonged hypoxia resulting in organ damage   Alternatives discussed:  Analgesia without sedation Universal protocol:    Immediately prior to procedure, a time out was called: yes     Patient identity confirmed:  Arm band,  provided demographic data and verbally with patient Indications:    Procedure performed:  Dislocation reduction   Procedure necessitating sedation performed by:  Physician performing sedation Pre-sedation assessment:    Time since last food  or drink:  Unable to specify   ASA classification: class 2 - patient with mild systemic disease     Mouth opening:  3 or more finger widths   Mallampati score:  I - soft palate, uvula, fauces, pillars visible   Neck mobility: normal     Pre-sedation assessments completed and reviewed: airway patency, cardiovascular function, hydration status, mental status, nausea/vomiting, pain level, respiratory function and temperature   Immediate pre-procedure details:    Reassessment: Patient reassessed immediately prior to procedure     Reviewed: vital signs, relevant labs/tests and NPO status     Verified: bag valve mask available, emergency equipment available, intubation equipment available, IV patency confirmed, oxygen available, reversal medications available and suction available   Procedure details (see MAR for exact dosages):    Preoxygenation:  Nasal cannula   Sedation:  Propofol   Intended level of sedation: deep   Analgesia:  Fentanyl   Intra-procedure monitoring:  Blood pressure monitoring, cardiac monitor, continuous pulse oximetry, continuous capnometry, frequent LOC assessments and frequent vital sign checks   Intra-procedure events: none     Total Provider sedation time (minutes):  15 Post-procedure details:    Attendance: Constant attendance by certified staff until patient recovered     Recovery: Patient returned to pre-procedure baseline     Patient is stable for discharge or admission: yes     Procedure completion:  Tolerated well, no immediate complications Reduction of dislocation  Date/Time: 11/25/2020 8:54 PM Performed by: Lucrezia Starch, MD Authorized by: Lucrezia Starch, MD  Consent: Verbal consent obtained. Consent given by:  patient Patient understanding: patient states understanding of the procedure being performed Patient identity confirmed: verbally with patient and arm band Time out: Immediately prior to procedure a "time out" was called to verify the correct patient, procedure, equipment, support staff and site/side marked as required.     ____________________________________________   INITIAL IMPRESSION / [place] / ED COURSE      Patient presents with above to history exam for assessment after mechanical fall described above.  On arrival she is hypertensive with otherwise stable vital signs on room air.  On exam she does have deformity tenderness of the left shoulder decreased strength as well as some pain in the left elbow, posterior head, thoracic and lumbar spine.  She otherwise is neurovascularly intact in her extremities.  No history exam features suggest acute infectious process, significant metabolic derangement or syncopal episode.  CT head shows no acute skull fracture or intracranial hemorrhage or other acute abnormality.  There is mild chronic microvascular ischemic changes noted.  CT C-spine is unremarkable for acute injury.  Film of the left shoulder shows anterior inferior dislocation of the left humeral head and a comminuted displaced fracture of the radial tuberosity.  Plain film left humerus shows same findings without any other humeral fractures.  Plain film the left elbow is unremarkable for fracture or dislocation.  Plain film of the left wrist shows no acute fracture dislocation.  Plain film of the T and L-spine shows no acute fracture dislocation  Discussed patient's fracture dislocation of the left shoulder with on-call orthopedist Dr. Roland Rack who recommended reduction in the emergency room.  Reduction performed successfully under procedural sedation per procedure note above.  Confirmed on postreduction plain film.  Patient discharged stable condition.  Strict return precautions  advised discussed.  Below noted analgesia Rx written.      ____________________________________________   FINAL CLINICAL IMPRESSION(S) / ED DIAGNOSES  Final diagnoses:  Traumatic closed displaced fracture of left shoulder with anterior dislocation, initial encounter    Medications  HYDROcodone-acetaminophen (NORCO/VICODIN) 5-325 MG per tablet 1 tablet (has no administration in time range)  ondansetron (ZOFRAN) injection 4 mg (4 mg Intravenous Given 11/25/20 1924)  lactated ringers bolus 1,000 mL (1,000 mLs Intravenous New Bag/Given (Non-Interop) 11/25/20 1924)  fentaNYL (SUBLIMAZE) injection 50 mcg (50 mcg Intravenous Given 11/25/20 1924)  propofol (DIPRIVAN) 10 mg/mL bolus/IV push 63.5 mg (63.5 mg Intravenous Given 11/25/20 1943)     ED Discharge Orders          Ordered    HYDROcodone-acetaminophen (NORCO) 5-325 MG tablet  Every 6 hours PRN        11/25/20 2042    ondansetron (ZOFRAN) 4 MG tablet  Every 8 hours PRN        11/25/20 2042             Note:  This document was prepared using Dragon voice recognition software and may include unintentional dictation errors.    Lucrezia Starch, MD 11/25/20 2055

## 2020-11-28 ENCOUNTER — Ambulatory Visit
Admission: RE | Admit: 2020-11-28 | Discharge: 2020-11-28 | Disposition: A | Discharge status: Home / Self Care | Payer: Medicare HMO | Source: Ambulatory Visit | Attending: Student | Admitting: Student

## 2020-11-28 ENCOUNTER — Other Ambulatory Visit: Payer: Self-pay

## 2020-11-28 ENCOUNTER — Other Ambulatory Visit: Payer: Self-pay | Admitting: Student

## 2020-11-28 ENCOUNTER — Ambulatory Visit: Payer: Medicare HMO | Admitting: Physical Therapy

## 2020-11-28 DIAGNOSIS — S42252A Displaced fracture of greater tuberosity of left humerus, initial encounter for closed fracture: Secondary | ICD-10-CM

## 2020-11-30 ENCOUNTER — Other Ambulatory Visit: Payer: Self-pay | Admitting: Surgery

## 2020-12-04 ENCOUNTER — Other Ambulatory Visit: Payer: Self-pay

## 2020-12-04 ENCOUNTER — Other Ambulatory Visit
Admission: RE | Admit: 2020-12-04 | Discharge: 2020-12-04 | Disposition: A | Discharge status: Home / Self Care | Payer: Medicare HMO | Source: Ambulatory Visit | Attending: Surgery | Admitting: Surgery

## 2020-12-04 ENCOUNTER — Encounter
Admission: RE | Admit: 2020-12-04 | Discharge: 2020-12-04 | Disposition: A | Discharge status: Home / Self Care | Payer: Medicare HMO | Source: Ambulatory Visit | Attending: Surgery | Admitting: Surgery

## 2020-12-04 ENCOUNTER — Encounter: Payer: Self-pay | Admitting: Surgery

## 2020-12-04 DIAGNOSIS — Z01818 Encounter for other preprocedural examination: Secondary | ICD-10-CM | POA: Diagnosis present

## 2020-12-04 LAB — BASIC METABOLIC PANEL
Anion gap: 11 (ref 5–15)
BUN: 23 mg/dL (ref 8–23)
CO2: 28 mmol/L (ref 22–32)
Calcium: 9.2 mg/dL (ref 8.9–10.3)
Chloride: 103 mmol/L (ref 98–111)
Creatinine, Ser: 0.81 mg/dL (ref 0.44–1.00)
GFR, Estimated: >60 mL/min (ref >60)
Glucose, Bld: 86 mg/dL (ref 70–99)
Potassium: 4.2 mmol/L (ref 3.5–5.1)
Sodium: 142 mmol/L (ref 135–145)

## 2020-12-04 LAB — CBC
HCT: 36.7 % (ref 36.0–46.0)
Hemoglobin: 11.9 g/dL — ABNORMAL LOW (ref 12.0–15.0)
MCH: 31.7 pg (ref 26.0–34.0)
MCHC: 32.4 g/dL (ref 30.0–36.0)
MCV: 97.9 fL (ref 80.0–100.0)
Platelets: 218 10*3/uL (ref 150–400)
RBC: 3.75 MIL/uL — ABNORMAL LOW (ref 3.87–5.11)
RDW: 14.1 % (ref 11.5–15.5)
WBC: 5.7 10*3/uL (ref 4.0–10.5)
nRBC: 0 % (ref 0.0–0.2)

## 2020-12-04 NOTE — Patient Instructions (Addendum)
Your procedure is scheduled on: 12/06/2020  Report to the Registration Desk on the 1st floor of the [facility]. To find out your arrival time, please call [phone] between 1PM - 3PM on: 12/05/2020   REMEMBER: Instructions that are not followed completely may result in serious medical risk, up to and including death; or upon the discretion of your surgeon and anesthesiologist your surgery may need to be rescheduled.  Do not eat food after midnight the night before surgery.  No gum chewing, lozengers or hard candies.  You may however, drink CLEAR liquids up to 2 hours before you are scheduled to arrive for your surgery. Do not drink anything within 2 hours of your scheduled arrival time.  Clear liquids include: - water  - apple juice without pulp - gatorade (not RED, PURPLE, OR BLUE) - black coffee or tea (Do NOT add milk or creamers to the coffee or tea) Do NOT drink anything that is not on this list.  In addition, your doctor has ordered for you to drink the provided  Ensure Pre-Surgery Clear Carbohydrate Drink  Drinking this carbohydrate drink up to two hours before surgery helps to reduce insulin resistance and improve patient outcomes. Please complete drinking 2 hours prior to scheduled arrival time.  TAKE THESE MEDICATIONS THE MORNING OF SURGERY WITH A SIP OF WATER: Zoloft Lipitor klonopin   Follow recommendations from your surgeon regarding stopping Aspirin.   One week prior to surgery: Stop Anti-inflammatories (NSAIDS) such as Advil, Aleve, Ibuprofen, Motrin, Naproxen, Naprosyn and Aspirin based products such as Excedrin, Goodys Powder, BC Powder. Stop ANY OVER THE COUNTER supplements until after surgery. You may however, continue to take Tylenol if needed for pain up until the day of surgery.  No Alcohol for 24 hours before or after surgery.  No Smoking including e-cigarettes for 24 hours prior to surgery.  No chewable tobacco products for at least 6 hours prior to  surgery.  No nicotine patches on the day of surgery.  Do not use any "recreational" drugs for at least a week prior to your surgery.  Please be advised that the combination of cocaine and anesthesia may have negative outcomes, up to and including death. If you test positive for cocaine, your surgery will be cancelled.  On the morning of surgery brush your teeth with toothpaste and water, you may rinse your mouth with mouthwash if you wish. Do not swallow any toothpaste or mouthwash.  Do not wear jewelry, make-up, hairpins, clips or nail polish.  Do not wear lotions, powders, or perfumes.   Do not shave body from the neck down 48 hours prior to surgery just in case you cut yourself which could leave a site for infection.  Also, freshly shaved skin may become irritated if using the CHG soap.  Contact lenses, hearing aids and dentures may not be worn into surgery.  Do not bring valuables to the hospital. [facility] is not responsible for any missing/lost belongings or valuables.   Use CHG Soap or wipes as directed on instruction sheet.   Notify your doctor if there is any change in your medical condition (cold, fever, infection).  Wear comfortable clothing (specific to your surgery type) to the hospital.  After surgery, you can help prevent lung complications by doing breathing exercises.  Take deep breaths and cough every 1-2 hours. Your doctor may order a device called an Incentive Spirometer to help you take deep breaths.  If you are being admitted to the hospital overnight,  leave your suitcase in the car. After surgery it may be brought to your room.  If you are being discharged the day of surgery, you will not be allowed to drive home. You will need a responsible adult (18 years or older) to drive you home and stay with you that night.   If you are taking public transportation, you will need to have a responsible adult (18 years or older) with you. Please confirm with your  physician that it is acceptable to use public transportation.   Please call the [facility] Dept. at [phone] if you have any questions about these instructions.  Surgery Visitation Policy:  Patients undergoing a surgery or procedure may have one family member or support person with them as long as that person is not COVID-19 positive or experiencing its symptoms.  That person may remain in the waiting area during the procedure.  Inpatient Visitation:    Visiting hours are 7 a.m. to 8 p.m. Inpatients will be allowed two visitors daily. The visitors may change each day during the patient's stay. No visitors under the age of 74. Any visitor under the age of 75 must be accompanied by an adult. The visitor must pass COVID-19 screenings, use hand sanitizer when entering and exiting the patient's room and wear a mask at all times, including in the patient's room. Patients must also wear a mask when staff or their visitor are in the room. Masking is required regardless of vaccination status.

## 2020-12-04 NOTE — Patient Instructions (Signed)
Your procedure is scheduled on:12/06/2020  Report to the Registration Desk on the 1st floor of the [facility]. To find out your arrival time, please call [phone] between 1PM - 3PM on: 12/05/2020  REMEMBER: Instructions that are not followed completely may result in serious medical risk, up to and including death; or upon the discretion of your surgeon and anesthesiologist your surgery may need to be rescheduled.  Do not eat food after midnight the night before surgery.  No gum chewing, lozengers or hard candies.  You may however, drink CLEAR liquids up to 2 hours before you are scheduled to arrive for your surgery. Do not drink anything within 2 hours of your scheduled arrival time.  Clear liquids include: - water  - apple juice without pulp - gatorade (not RED, PURPLE, OR BLUE) - black coffee or tea (Do NOT add milk or creamers to the coffee or tea) Do NOT drink anything that is not on this list.   In addition, your doctor has ordered for you to drink the provided  Pre-Surgery Clear Carbohydrate Drink   Drinking this carbohydrate drink up to two hours before surgery helps to reduce insulin resistance and improve patient outcomes. Please complete drinking 2 hours prior to scheduled arrival time.  TAKE THESE MEDICATIONS THE MORNING OF SURGERY WITH A SIP OF WATER:  Lpipt Clonopin Zoloft     Follow recommendations from Cardiologist, Pulmonologist or PCP regarding stopping Aspirin, Coumadin, Plavix, Eliquis, Pradaxa, or Pletal.  One week prior to surgery: Stop Anti-inflammatories (NSAIDS) such as Advil, Aleve, Ibuprofen, Motrin, Naproxen, Naprosyn and Aspirin based products such as Excedrin, Goodys Powder, BC Powder. Stop ANY OVER THE COUNTER supplements until after surgery. You may however, continue to take Tylenol if needed for pain up until the day of surgery.  No Alcohol for 24 hours before or after surgery.  No Smoking including e-cigarettes for 24 hours prior to  surgery.  No chewable tobacco products for at least 6 hours prior to surgery.  No nicotine patches on the day of surgery.  Do not use any "recreational" drugs for at least a week prior to your surgery.  Please be advised that the combination of cocaine and anesthesia may have negative outcomes, up to and including death. If you test positive for cocaine, your surgery will be cancelled.  On the morning of surgery brush your teeth with toothpaste and water, you may rinse your mouth with mouthwash if you wish. Do not swallow any toothpaste or mouthwash.  Do not wear jewelry, make-up, hairpins, clips or nail polish.  Do not wear lotions, powders, or perfumes.   Do not shave body from the neck down 48 hours prior to surgery just in case you cut yourself which could leave a site for infection.  Also, freshly shaved skin may become irritated if using the CHG soap.  Contact lenses, hearing aids and dentures may not be worn into surgery.  Do not bring valuables to the hospital. [facility] is not responsible for any missing/lost belongings or valuables.   Use CHG Soap or wipes as directed on instruction sheet.-provided   Notify your doctor if there is any change in your medical condition (cold, fever, infection).  Wear comfortable clothing (specific to your surgery type) to the hospital.  After surgery, you can help prevent lung complications by doing breathing exercises.  Take deep breaths and cough every 1-2 hours. Your doctor may order a device called an Incentive Spirometer to help you take deep breaths.  If you are being admitted to the hospital overnight, leave your suitcase in the car. After surgery it may be brought to your room.  If you are being discharged the day of surgery, you will not be allowed to drive home. You will need a responsible adult (18 years or older) to drive you home and stay with you that night.   If you are taking public transportation, you will need to have  a responsible adult (18 years or older) with you. Please confirm with your physician that it is acceptable to use public transportation.   Please call the [facility] Dept. at [phone] if you have any questions about these instructions.  Surgery Visitation Policy:  Patients undergoing a surgery or procedure may have one family member or support person with them as long as that person is not COVID-19 positive or experiencing its symptoms.  That person may remain in the waiting area during the procedure.  Inpatient Visitation:    Visiting hours are 7 a.m. to 8 p.m. Inpatients will be allowed two visitors daily. The visitors may change each day during the patient's stay. No visitors under the age of 81. Any visitor under the age of 63 must be accompanied by an adult. The visitor must pass COVID-19 screenings, use hand sanitizer when entering and exiting the patient's room and wear a mask at all times, including in the patient's room. Patients must also wear a mask when staff or their visitor are in the room. Masking is required regardless of vaccination status.

## 2020-12-05 ENCOUNTER — Ambulatory Visit: Payer: Medicare HMO | Admitting: Physical Therapy

## 2020-12-05 MED ORDER — CEFAZOLIN SODIUM-DEXTROSE 2-4 GM/100ML-% IV SOLN
2.0000 g | INTRAVENOUS | Status: AC
  Administered 2020-12-06: 2 g via INTRAVENOUS

## 2020-12-05 MED ORDER — LACTATED RINGERS IV SOLN: INTRAVENOUS | Status: DC

## 2020-12-05 MED ORDER — ORAL CARE MOUTH RINSE: 15.0000 mL | Freq: Once | OROMUCOSAL | Status: AC

## 2020-12-05 MED ORDER — CHLORHEXIDINE GLUCONATE 0.12 % MT SOLN: 15.0000 mL | Freq: Once | OROMUCOSAL | Status: AC

## 2020-12-05 MED ORDER — FAMOTIDINE 20 MG PO TABS: 20.0000 mg | ORAL_TABLET | Freq: Once | ORAL | Status: AC

## 2020-12-06 ENCOUNTER — Ambulatory Visit: Payer: Medicare HMO | Admitting: Certified Registered"

## 2020-12-06 ENCOUNTER — Ambulatory Visit
Admission: RE | Admit: 2020-12-06 | Discharge: 2020-12-06 | Disposition: A | Discharge status: Home / Self Care | Payer: Medicare HMO | Attending: Surgery | Admitting: Surgery

## 2020-12-06 ENCOUNTER — Encounter: Payer: Self-pay | Admitting: Surgery

## 2020-12-06 ENCOUNTER — Encounter
Admission: RE | Disposition: A | Discharge status: Home / Self Care | Payer: Self-pay | Source: Home / Self Care | Attending: Surgery

## 2020-12-06 ENCOUNTER — Ambulatory Visit: Payer: Medicare HMO | Admitting: Urgent Care

## 2020-12-06 ENCOUNTER — Ambulatory Visit: Payer: Medicare HMO

## 2020-12-06 ENCOUNTER — Other Ambulatory Visit: Payer: Self-pay

## 2020-12-06 DIAGNOSIS — I69398 Other sequelae of cerebral infarction: Secondary | ICD-10-CM | POA: Diagnosis not present

## 2020-12-06 DIAGNOSIS — Z7982 Long term (current) use of aspirin: Secondary | ICD-10-CM | POA: Diagnosis not present

## 2020-12-06 DIAGNOSIS — S42252A Displaced fracture of greater tuberosity of left humerus, initial encounter for closed fracture: Secondary | ICD-10-CM | POA: Diagnosis not present

## 2020-12-06 DIAGNOSIS — Z885 Allergy status to narcotic agent status: Secondary | ICD-10-CM | POA: Insufficient documentation

## 2020-12-06 DIAGNOSIS — M329 Systemic lupus erythematosus, unspecified: Secondary | ICD-10-CM | POA: Diagnosis not present

## 2020-12-06 DIAGNOSIS — Z883 Allergy status to other anti-infective agents status: Secondary | ICD-10-CM | POA: Insufficient documentation

## 2020-12-06 DIAGNOSIS — W010XXA Fall on same level from slipping, tripping and stumbling without subsequent striking against object, initial encounter: Secondary | ICD-10-CM | POA: Insufficient documentation

## 2020-12-06 DIAGNOSIS — S46012A Strain of muscle(s) and tendon(s) of the rotator cuff of left shoulder, initial encounter: Secondary | ICD-10-CM | POA: Insufficient documentation

## 2020-12-06 DIAGNOSIS — Z881 Allergy status to other antibiotic agents status: Secondary | ICD-10-CM | POA: Insufficient documentation

## 2020-12-06 DIAGNOSIS — Z79899 Other long term (current) drug therapy: Secondary | ICD-10-CM | POA: Insufficient documentation

## 2020-12-06 DIAGNOSIS — Z7901 Long term (current) use of anticoagulants: Secondary | ICD-10-CM | POA: Diagnosis not present

## 2020-12-06 DIAGNOSIS — Z419 Encounter for procedure for purposes other than remedying health state, unspecified: Secondary | ICD-10-CM

## 2020-12-06 HISTORY — PX: ORIF HUMERUS FRACTURE: SHX2126

## 2020-12-06 LAB — TYPE AND SCREEN
ABO/RH(D): O NEG
Antibody Screen: NEGATIVE

## 2020-12-06 LAB — ABO/RH: ABO/RH(D): O NEG

## 2020-12-06 SURGERY — OPEN REDUCTION INTERNAL FIXATION (ORIF) PROXIMAL HUMERUS FRACTURE
Anesthesia: General | Site: Shoulder | Laterality: Left

## 2020-12-06 MED ORDER — HYDROCODONE-ACETAMINOPHEN 5-325 MG PO TABS: 1.0000 | ORAL_TABLET | ORAL | Status: DC | PRN

## 2020-12-06 MED ORDER — HYDROCODONE-ACETAMINOPHEN 5-325 MG PO TABS: 1.0000 | ORAL_TABLET | Freq: Four times a day (QID) | ORAL | 0 refills | Status: DC | PRN

## 2020-12-06 MED ORDER — FENTANYL CITRATE (PF) 100 MCG/2ML IJ SOLN
INTRAMUSCULAR | Status: DC | PRN
  Administered 2020-12-06 (×2): 50 ug via INTRAVENOUS

## 2020-12-06 MED ORDER — DEXAMETHASONE SODIUM PHOSPHATE 10 MG/ML IJ SOLN
INTRAMUSCULAR | Status: DC | PRN
  Administered 2020-12-06: 10 mg via INTRAVENOUS

## 2020-12-06 MED ORDER — SODIUM CHLORIDE FLUSH 0.9 % IV SOLN
INTRAVENOUS | Status: AC
  Filled 2020-12-06: qty 40

## 2020-12-06 MED ORDER — FENTANYL CITRATE (PF) 100 MCG/2ML IJ SOLN: 25.0000 ug | INTRAMUSCULAR | Status: DC | PRN

## 2020-12-06 MED ORDER — BUPIVACAINE LIPOSOME 1.3 % IJ SUSP
INTRAMUSCULAR | Status: AC
  Filled 2020-12-06: qty 20

## 2020-12-06 MED ORDER — ONDANSETRON HCL 4 MG/2ML IJ SOLN
INTRAMUSCULAR | Status: AC
  Filled 2020-12-06: qty 2

## 2020-12-06 MED ORDER — KETOROLAC TROMETHAMINE 30 MG/ML IJ SOLN
INTRAMUSCULAR | Status: DC | PRN
  Administered 2020-12-06: 15 mg via INTRAVENOUS

## 2020-12-06 MED ORDER — DEXAMETHASONE SODIUM PHOSPHATE 10 MG/ML IJ SOLN
INTRAMUSCULAR | Status: AC
  Filled 2020-12-06: qty 1

## 2020-12-06 MED ORDER — ONDANSETRON 4 MG PO TBDP: 4.0000 mg | ORAL_TABLET | Freq: Three times a day (TID) | ORAL | 1 refills | Status: DC | PRN

## 2020-12-06 MED ORDER — METOCLOPRAMIDE HCL 10 MG PO TABS: 5.0000 mg | ORAL_TABLET | Freq: Three times a day (TID) | ORAL | Status: DC | PRN

## 2020-12-06 MED ORDER — ROCURONIUM BROMIDE 100 MG/10ML IV SOLN
INTRAVENOUS | Status: DC | PRN
  Administered 2020-12-06: 20 mg via INTRAVENOUS
  Administered 2020-12-06: 50 mg via INTRAVENOUS

## 2020-12-06 MED ORDER — 0.9 % SODIUM CHLORIDE (POUR BTL) OPTIME
TOPICAL | Status: DC | PRN
  Administered 2020-12-06: 800 mL

## 2020-12-06 MED ORDER — PHENYLEPHRINE HCL (PRESSORS) 10 MG/ML IV SOLN
INTRAVENOUS | Status: AC
  Filled 2020-12-06: qty 1

## 2020-12-06 MED ORDER — METOCLOPRAMIDE HCL 5 MG/ML IJ SOLN: 5.0000 mg | Freq: Three times a day (TID) | INTRAMUSCULAR | Status: DC | PRN

## 2020-12-06 MED ORDER — KETOROLAC TROMETHAMINE 30 MG/ML IJ SOLN
INTRAMUSCULAR | Status: AC
  Filled 2020-12-06: qty 1

## 2020-12-06 MED ORDER — PROPOFOL 10 MG/ML IV BOLUS
INTRAVENOUS | Status: AC
  Filled 2020-12-06: qty 20

## 2020-12-06 MED ORDER — LIDOCAINE HCL (CARDIAC) PF 100 MG/5ML IV SOSY
PREFILLED_SYRINGE | INTRAVENOUS | Status: DC | PRN
  Administered 2020-12-06: 80 mg via INTRAVENOUS

## 2020-12-06 MED ORDER — FENTANYL CITRATE (PF) 100 MCG/2ML IJ SOLN
INTRAMUSCULAR | Status: AC
  Filled 2020-12-06: qty 2

## 2020-12-06 MED ORDER — HYDROMORPHONE HCL 1 MG/ML IJ SOLN
INTRAMUSCULAR | Status: AC
  Filled 2020-12-06: qty 1

## 2020-12-06 MED ORDER — HYDROMORPHONE HCL 1 MG/ML IJ SOLN
INTRAMUSCULAR | Status: DC | PRN
  Administered 2020-12-06: .5 mg via INTRAVENOUS

## 2020-12-06 MED ORDER — SUGAMMADEX SODIUM 200 MG/2ML IV SOLN
INTRAVENOUS | Status: DC | PRN
  Administered 2020-12-06: 200 mg via INTRAVENOUS

## 2020-12-06 MED ORDER — FAMOTIDINE 20 MG PO TABS
ORAL_TABLET | ORAL | Status: AC
  Administered 2020-12-06: 20 mg via ORAL
  Filled 2020-12-06: qty 1

## 2020-12-06 MED ORDER — KETOROLAC TROMETHAMINE 15 MG/ML IJ SOLN: 15.0000 mg | Freq: Once | INTRAMUSCULAR | Status: DC

## 2020-12-06 MED ORDER — SODIUM CHLORIDE (PF) 0.9 % IJ SOLN
INTRAMUSCULAR | Status: DC | PRN
  Administered 2020-12-06: 60 mL via SURGICAL_CAVITY

## 2020-12-06 MED ORDER — ACETAMINOPHEN 10 MG/ML IV SOLN
INTRAVENOUS | Status: AC
  Filled 2020-12-06: qty 100

## 2020-12-06 MED ORDER — ACETAMINOPHEN 10 MG/ML IV SOLN
INTRAVENOUS | Status: DC | PRN
  Administered 2020-12-06: 1000 mg via INTRAVENOUS

## 2020-12-06 MED ORDER — BUPIVACAINE-EPINEPHRINE (PF) 0.5% -1:200000 IJ SOLN
INTRAMUSCULAR | Status: AC
  Filled 2020-12-06: qty 30

## 2020-12-06 MED ORDER — LIDOCAINE HCL (PF) 2 % IJ SOLN
INTRAMUSCULAR | Status: AC
  Filled 2020-12-06: qty 5

## 2020-12-06 MED ORDER — ONDANSETRON HCL 4 MG/2ML IJ SOLN: 4.0000 mg | Freq: Four times a day (QID) | INTRAMUSCULAR | Status: DC | PRN

## 2020-12-06 MED ORDER — TRANEXAMIC ACID 1000 MG/10ML IV SOLN
INTRAVENOUS | Status: AC
  Filled 2020-12-06: qty 10

## 2020-12-06 MED ORDER — ONDANSETRON HCL 4 MG PO TABS: 4.0000 mg | ORAL_TABLET | Freq: Four times a day (QID) | ORAL | Status: DC | PRN

## 2020-12-06 MED ORDER — ROCURONIUM BROMIDE 10 MG/ML (PF) SYRINGE
PREFILLED_SYRINGE | INTRAVENOUS | Status: AC
  Filled 2020-12-06: qty 10

## 2020-12-06 MED ORDER — PHENYLEPHRINE HCL-NACL 20-0.9 MG/250ML-% IV SOLN
INTRAVENOUS | Status: DC | PRN
  Administered 2020-12-06: 30 ug/min via INTRAVENOUS

## 2020-12-06 MED ORDER — ONDANSETRON HCL 4 MG/2ML IJ SOLN
INTRAMUSCULAR | Status: DC | PRN
  Administered 2020-12-06: 4 mg via INTRAVENOUS

## 2020-12-06 MED ORDER — PROPOFOL 10 MG/ML IV BOLUS
INTRAVENOUS | Status: DC | PRN
  Administered 2020-12-06: 70 mg via INTRAVENOUS
  Administered 2020-12-06: 130 mg via INTRAVENOUS

## 2020-12-06 MED ORDER — CHLORHEXIDINE GLUCONATE 0.12 % MT SOLN
OROMUCOSAL | Status: AC
  Administered 2020-12-06: 15 mL via OROMUCOSAL
  Filled 2020-12-06: qty 15

## 2020-12-06 SURGICAL SUPPLY — 70 items
ANCH SUT 5.5 KNTLS (Anchor) ×1 IMPLANT
ANCHOR HEALICOIL REGEN 5.5 (Anchor) ×2 IMPLANT
APL PRP STRL LF DISP 70% ISPRP (MISCELLANEOUS) ×2
BIT DRILL CANN 3.2MM (BIT) ×1 IMPLANT
BNDG COHESIVE 4X5 TAN ST LF (GAUZE/BANDAGES/DRESSINGS) ×2 IMPLANT
BNDG ELASTIC 4X5.8 VLCR STR LF (GAUZE/BANDAGES/DRESSINGS) ×4 IMPLANT
CHLORAPREP W/TINT 26 (MISCELLANEOUS) ×4 IMPLANT
COOLER POLAR GLACIER W/PUMP (MISCELLANEOUS) ×2 IMPLANT
DILATOR 5.5 THREADED HEALICOIL (MISCELLANEOUS) ×2 IMPLANT
DRAPE C-ARM XRAY 36X54 (DRAPES) ×2 IMPLANT
DRAPE IMP U-DRAPE 54X76 (DRAPES) ×4 IMPLANT
DRAPE INCISE IOBAN 66X45 STRL (DRAPES) ×4 IMPLANT
DRAPE SHEET LG 3/4 BI-LAMINATE (DRAPES) ×2 IMPLANT
DRAPE U-SHAPE 47X51 STRL (DRAPES) ×2 IMPLANT
DRILL BIT CANN 3.2MM (BIT) ×1
DRSG OPSITE POSTOP 4X10 (GAUZE/BANDAGES/DRESSINGS) IMPLANT
DRSG OPSITE POSTOP 4X6 (GAUZE/BANDAGES/DRESSINGS) ×2 IMPLANT
DRSG OPSITE POSTOP 4X8 (GAUZE/BANDAGES/DRESSINGS) ×2 IMPLANT
ELECT CAUTERY BLADE 6.4 (BLADE) ×2 IMPLANT
ELECT REM PT RETURN 9FT ADLT (ELECTROSURGICAL) ×2
ELECTRODE REM PT RTRN 9FT ADLT (ELECTROSURGICAL) ×1 IMPLANT
GAUZE SPONGE 4X4 12PLY STRL (GAUZE/BANDAGES/DRESSINGS) ×2 IMPLANT
GAUZE XEROFORM 1X8 LF (GAUZE/BANDAGES/DRESSINGS) ×2 IMPLANT
GLOVE SRG 8 PF TXTR STRL LF DI (GLOVE) ×1 IMPLANT
GLOVE SURG ENC MOIS LTX SZ7.5 (GLOVE) ×8 IMPLANT
GLOVE SURG ENC MOIS LTX SZ8 (GLOVE) ×8 IMPLANT
GLOVE SURG UNDER LTX SZ8 (GLOVE) ×2 IMPLANT
GLOVE SURG UNDER POLY LF SZ8 (GLOVE) ×2
GOWN STRL REUS W/ TWL LRG LVL3 (GOWN DISPOSABLE) ×3 IMPLANT
GOWN STRL REUS W/ TWL XL LVL3 (GOWN DISPOSABLE) ×2 IMPLANT
GOWN STRL REUS W/TWL LRG LVL3 (GOWN DISPOSABLE) ×6
GOWN STRL REUS W/TWL XL LVL3 (GOWN DISPOSABLE) ×4
HANDLE YANKAUER SUCT BULB TIP (MISCELLANEOUS) ×2 IMPLANT
KIT STABILIZATION SHOULDER (MISCELLANEOUS) ×2 IMPLANT
KIT TURNOVER KIT A (KITS) ×2 IMPLANT
MANIFOLD NEPTUNE II (INSTRUMENTS) ×2 IMPLANT
MASK FACE SPIDER DISP (MASK) ×2 IMPLANT
MAT ABSORB  FLUID 56X50 GRAY (MISCELLANEOUS) ×2
MAT ABSORB FLUID 56X50 GRAY (MISCELLANEOUS) ×1 IMPLANT
NDL HPO THNWL 1X22GA REG BVL (NEEDLE) ×1 IMPLANT
NEEDLE HYPO 25X1 1.5 SAFETY (NEEDLE) ×2 IMPLANT
NEEDLE MAYO 6 CRC TAPER PT (NEEDLE) ×6 IMPLANT
NEEDLE SAFETY 22GX1 (NEEDLE) ×2
NEEDLE SPNL 20GX3.5 QUINCKE YW (NEEDLE) ×2 IMPLANT
NS IRRIG 1000ML POUR BTL (IV SOLUTION) ×2 IMPLANT
PACK ARTHROSCOPY SHOULDER (MISCELLANEOUS) ×2 IMPLANT
PAD CAST CTTN 4X4 STRL (SOFTGOODS) ×2 IMPLANT
PAD WRAPON POLAR SHDR XLG (MISCELLANEOUS) ×1 IMPLANT
PADDING CAST COTTON 4X4 STRL (SOFTGOODS) ×4
PULSAVAC PLUS IRRIG FAN TIP (DISPOSABLE) ×2
SCREW CANN 36MM FULLY (Screw) ×2 IMPLANT
SCREW CANN 38MM FULLY (Screw) ×2 IMPLANT
SCREW CANN 42MM FULLY (Screw) ×2 IMPLANT
SLING ULTRA II LG (MISCELLANEOUS) ×2 IMPLANT
SPONGE T-LAP 18X18 ~~LOC~~+RFID (SPONGE) ×4 IMPLANT
STAPLER SKIN PROX 35W (STAPLE) ×2 IMPLANT
STOCKINETTE IMPERVIOUS 9X36 MD (GAUZE/BANDAGES/DRESSINGS) ×2 IMPLANT
SUT ETHIBOND 0 MO6 C/R (SUTURE) ×2 IMPLANT
SUT FIBERWIRE #2 38 BLUE 1/2 (SUTURE) ×2
SUT PROLENE 4 0 PS 2 18 (SUTURE) ×4 IMPLANT
SUT VIC AB 0 CT1 36 (SUTURE) ×2 IMPLANT
SUT VIC AB 2-0 CT1 27 (SUTURE) ×8
SUT VIC AB 2-0 CT1 TAPERPNT 27 (SUTURE) ×4 IMPLANT
SUTURE FIBERWR #2 38 BLUE 1/2 (SUTURE) ×1 IMPLANT
SYR 10ML LL (SYRINGE) ×2 IMPLANT
TIP FAN IRRIG PULSAVAC PLUS (DISPOSABLE) ×1 IMPLANT
WASHER FOR 4.5 SCREWS (Washer) ×4 IMPLANT
WATER STERILE IRR 500ML POUR (IV SOLUTION) ×2 IMPLANT
WIRE G 1.6X150 SPADE (MISCELLANEOUS) ×2 IMPLANT
WRAPON POLAR PAD SHDR XLG (MISCELLANEOUS) ×2

## 2020-12-06 NOTE — Anesthesia Preprocedure Evaluation (Signed)
Anesthesia Evaluation  Patient identified by MRN, date of birth, ID band Patient awake    Reviewed: Allergy & Precautions, NPO status , Patient's Chart, lab work & pertinent test results  History of Anesthesia Complications Negative for: history of anesthetic complications  Airway Mallampati: III  TM Distance: >3 FB Neck ROM: full    Dental  (+) Chipped   Pulmonary neg shortness of breath, COPD, Current Smoker and Patient abstained from smoking.,    Pulmonary exam normal        Cardiovascular Exercise Tolerance: Good hypertension, (-) angina(-) Past MI and (-) DOE Normal cardiovascular exam     Neuro/Psych 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), Residual Symptoms    GI/Hepatic negative GI ROS, Neg liver ROS, GERD  ,  Endo/Other  negative endocrine ROS  Renal/GU      Musculoskeletal  (+) Arthritis ,   Abdominal   Peds  Hematology negative hematology ROS (+)   Anesthesia Other Findings Past Medical History: No date: Allergic rhinitis No date: Anxiety No date: Arthritis No date: Cancer [facility])     Comment:  skin No date: Cervical disc disease No date: COPD (chronic obstructive pulmonary disease) (HCC) No date: Depression No date: GERD (gastroesophageal reflux disease) No date: Hypertension No date: IBS (irritable bowel syndrome) No date: Lumbar disc disease No date: Lupus anticoagulant positive No date: Multiple sclerosis ([place]) No date: Stroke [facility])     Comment:  06/2012 No date: Thoracic disc disease  Past Surgical History: No date: ABDOMINAL HYSTERECTOMY 1990: BREAST EXCISIONAL BIOPSY; Right     Comment:  x 2, neg No date: colon polyps No date: colonoscipy 09/15/2016: COLONOSCOPY WITH PROPOFOL; N/A     Comment:  Procedure: COLONOSCOPY WITH PROPOFOL;  Surgeon: Manya Silvas, MD;  Location: [facility] ENDOSCOPY;  Service:               Endoscopy;  Laterality: N/A; 1990: POLYPECTOMY;  Right     Comment:  throat polyp removed by Dr Ladene Artist.  No date: TONSILLECTOMY  BMI    Body Mass Index: 26.52 kg/m      Reproductive/Obstetrics negative OB ROS                             Anesthesia Physical Anesthesia Plan  ASA: 3  Anesthesia Plan: General ETT   Post-op Pain Management:    Induction: Intravenous  PONV Risk Score and Plan: Ondansetron, Dexamethasone, Midazolam and Treatment may vary due to age or medical condition  Airway Management Planned: Oral ETT  Additional Equipment:   Intra-op Plan:   Post-operative Plan: Extubation in OR  Informed Consent: I have reviewed the patients History and Physical, chart, labs and discussed the procedure including the risks, benefits and alternatives for the proposed anesthesia with the patient or authorized representative who has indicated his/her understanding and acceptance.     Dental Advisory Given  Plan Discussed with: Anesthesiologist, CRNA and Surgeon  Anesthesia Plan Comments: (Thorough discussion with patient about risk vs benefits of PNB in a patient with COPD and MS/Stroke that affects the surgical arm.  Patient declines the block at this time but does consent to post Op block PRN.  Patient consented for risks of anesthesia including but not limited to:  - adverse reactions to medications - damage to eyes, teeth, lips or other oral mucosa - nerve damage due to positioning  -  sore throat or hoarseness - Damage to heart, brain, nerves, lungs, other parts of body or loss of life  Patient voiced understanding.)        Anesthesia Quick Evaluation

## 2020-12-06 NOTE — H&P (Signed)
History of Present Illness: Angela Suarez is a 70 y.o. female who presents today for evaluation of a left shoulder injury sustained on 11/25/2020. The patient was on her garage steps when she tripped over a dog leash and fell forward. The patient fell forward and noticed immediate pain especially in her left shoulder. The patient was brought to emergency room where x-rays of the left shoulder demonstrated a anterior, inferior dislocation of the humeral head and a displaced fracture of the greater tuberosity. The patient did also undergo x-rays of the thoracic spine which were negative for acute fracture, x-rays of the lumbar spine were negative for acute fracture, x-rays of the elbow were negative for acute fracture. The patient also underwent a x-ray of the left wrist, CT of the head both which were negative for acute fracture. The patient underwent a closed reduction with sedation of the left shoulder and was placed in a shoulder sling and instructed to follow-up orthopedics. The patient is right-hand dominant. She is status post stroke which did leave some residual sensation deficits to left upper extremity but she denies any loss of strength to the left arm prior to her dislocation. She denies any changes in the numbness and ting of the left hand at today's appointment. The patient does report that she does all of her activities on her own at home including lifting above her head and driving. The patient reports a 4 out of 10 pain score in the left elbow while sitting however with any attempted motion pain can reach a 7 out of 10. The patient states that she was told by the emergency room physician to remove the shoulder sling at home and begin working on motion which she has been attempting to do. She does take aspirin 325 mg daily, she does have lupus anticoagulation syndrome. The patient also has a history of MS. The patient denies any personal history of heart attack, no history of asthma but does have COPD.  The patient is taking hydrocodone as needed for discomfort of her left arm at this time.  Past Medical History:  Allergic rhinitis   Allergy 2000   Anxiety and depression (chronic)   Arthritis knees   Cancer (CMS-HCC) - melanoma in situ skin cancer L chest 2012   Cervical disc disease   COPD (chronic obstructive pulmonary disease) (CMS-HCC) with continued tobacco abuse   CVA (cerebral vascular accident) (CMS-HCC) - possible;new left hemisensory symptoms 3/14;MRI with new diffusion restricting lesion in right thalamus and right putamen. Evaluated by Neurology   Depression just because of new pain in legs   Emphysema of lung (CMS-HCC) 12/06/2016 (mild)   Essential hypertension 08/14/2014   GERD (gastroesophageal reflux disease)   Gross hematuria   History of cataract (slight at this time)   History of colon polyps   Irritable bowel syndrome (IBS)   Lumbar disc disease   Lupus anticoagulant positive 7/15   Menopausal syndrome   Multiple sclerosis (CMS-HCC) - follwed by Dr Moshe Cipro at [facility]   Thoracic disc disease   Past Surgical History:  BREAST SURGERY right breast lump removal bengin 1970s (two breast biopsies)   carcinoma removal back and forehead sometime 1980s   COLONOSCOPY 09/25/1993 (Hyperplastic Polyp)   COLONOSCOPY 05/07/2011, 10/16/2005 (FH Colon Polyps (Father): CBF 04/2016)   COLONOSCOPY 09/15/2016 (FH Colon Polyps (Father): CBF 09/2021)   HYSTERECTOMY partial hysterectomy 1993   neck surgery 1997 N/A back of neck at c4 disk   SPINE SURGERY 04/08/1995 (neck herniated disc)  TONSILLECTOMY in 4th grade   vocal cord polyp (removed in past)   Past Family History:  Asthma Mother   Breast cancer Mother   COPD Mother   Lung cancer Mother (deceased)   Asthma Brother   COPD Father   Colon polyps Father   Lung cancer Father (deceased)   Brain cancer Father   Cancer Father   Lung cancer Other   Kidney cancer Neg Hx   Medications:  acetaminophen (TYLENOL) 500 MG  tablet Take 1,000 mg by mouth as needed for Pain   aspirin 325 MG EC tablet Take 325 mg by mouth once daily.   aspirin/acetaminophen/caffeine (EXCEDRIN EXTRA STRENGTH ORAL) Take by mouth Daily   atorvastatin (LIPITOR) 10 MG tablet Take 1 tablet (10 mg total) by mouth once daily 90 tablet 3   azelastine (ASTELIN) 137 mcg nasal spray Place 2 sprays into both nostrils 2 (two) times daily 30 mL 5   chlorpheniramine (CHLOR-TRIMETON) 4 mg tablet Take 4 mg by mouth as needed for Allergies.   cholecalciferol (VITAMIN D3) 2,000 unit capsule Take 2,000 Units by mouth once daily   clonazePAM (KLONOPIN) 1 MG tablet TAKE 1/2 TABLET BY MOUTH AT NOON AND TAKE 3 TABLETS BY MOUTH AT BEDTIME 105 tablet 5   cyanocobalamin (VITAMIN B12) 1000 MCG tablet Take 1,000 mcg by mouth once daily   HYDROcodone-acetaminophen (NORCO) 5-325 mg tablet Take 1-2 tablets by mouth every 6 (six) hours as needed for Pain 30 tablet 0   losartan (COZAAR) 25 MG tablet TAKE 1 TABLET BY MOUTH ONCE DAILY 90 tablet 1   ondansetron (ZOFRAN) 4 MG tablet Take by mouth Take 1 tablet (4 mg total) by mouth every 8 (eight) hours as needed for up to 10 doses for nausea or vomiting.   pregabalin (LYRICA) 150 MG capsule TAKE 1 CAPSULE BY MOUTH AT BEDTIME 90 capsule 1   sertraline (ZOLOFT) 100 MG tablet Take 1 tablet (100 mg total) by mouth once daily 90 tablet 3   UNABLE TO FIND Maximum strength urinary tract infection OTC   IBUPROFEN IB ORAL Take by mouth as needed. (Patient not taking: Reported on 11/28/2020)   Allergies:  Prevacid [Lansoprazole] Diarrhea   Codeine Nausea   Duloxetine Other (Disturbed dreams)  Flagyl [Metronidazole Hcl] Rash (Itching and body rash)   Lamisil [Terbinafine Hcl] Rash   Other Other (Cant take drugs that cause PML)   Oxycodone Vomiting   Prilosec [Omeprazole] Diarrhea   Tolmetin Diarrhea   Vicodin [Hydrocodone-Acetaminophen] Nausea   Review of Systems:  A comprehensive 14 point ROS was performed, reviewed by me  today, and the pertinent orthopaedic findings are documented in the HPI.  Physical Exam: BP 136/80  Ht 157.5 cm ('5\' 2"'$ )  Wt 66 kg (145 lb 9.6 oz)  LMP 08/12/1991  BMI 26.63 kg/m  General/Constitutional: The patient appears to be well-nourished, well-developed, and in no acute distress. Neuro/Psych: Normal mood and affect, oriented to person, place and time. Eyes: Non-icteric. Pupils are equal, round, and reactive to light, and exhibit synchronous movement. ENT: Unremarkable. Lymphatic: No palpable adenopathy. Respiratory: Lungs clear to auscultation, Normal chest excursion, No wheezes and Non-labored breathing Cardiovascular: Regular rate and rhythm. No murmurs. and No edema, swelling or tenderness, except as noted in detailed exam. Integumentary: No impressive skin lesions present, except as noted in detailed exam. Musculoskeletal: Unremarkable, except as noted in detailed exam.  The patient presents today without a shoulder sling applied to the left upper extremity. Skin examination of the left arm demonstrates  no open wound, erythema or significant ecchymosis. No deformity is noted to the left shoulder. The patient is intact light touch over the left upper extremity over the lateral aspect of the shoulder and forearm, slight decrease in sensation to the dorsal and volar aspect left hand. The patient is nontender palpation over the clavicle or subacromial space, moderate tenderness with palpation to the left proximal humerus. Nontender palpation to the left elbow, wrist and fingers. The patient is able to gently flex and extend left elbow without pain, full left wrist flexion extension without discomfort. She is able to make a full composite fist with 4+/5 grip strength. Cap refills intact to each individual digit. Radial pulses intact to the left wrist. Left shoulder range of motion was not evaluated at today's appointment.  Imaging: AP and Y scapular views of the left shoulder were obtained  today in the office and reviewed by me. These x-rays demonstrate evidence of a displaced greater tuberosity fracture. The fracture is displaced further when compared to initial postreduction films, it appears to be displaced superiorly approximately 9.5 mm. No other acute fractures visualized. There does not appear to be a bony Bank[place] injury at today's visit. No clavicle fracture. The shoulder is located within the humeral joint.  Impression: 1. Closed displaced fracture of greater tuberosity of left humerus. 2. Traumatic closed displaced fracture of left shoulder with anterior dislocation. 3. Traumatic tear of left rotator cuff.  Plan:  1. Treatment options were discussed today with the patient. 2. Spoke with the patient and her husband at today's visit about the displacement of the greater tuberosity fracture when compared to initial postreduction films. 3. Instructed the patient that if we were to leave the fracture fragment in place and treat nonsurgically I believe that the patient would have a moderate to significant amount of impingement with any attempted motion. 4. The patient is still active and would like to proceed with surgical intervention, MRI scan of the left shoulder has been ordered for further evaluation. 5. The patient was instructed on the risk and benefits of surgery and wishes to proceed at this time. This document will serve as a surgical history and physical. 6. The patient will follow-up with me per standard postop procedure. They can call the clinic they have any questions, new symptoms develop or symptoms worsen.  The procedure was discussed with the patient, as were the potential risks (including bleeding, infection, nerve and/or blood vessel injury, persistent or recurrent pain, failure of the repair, progression of arthritis, need for further surgery, blood clots, strokes, heart attacks and/or arhythmias, pneumonia, etc.) and benefits. The patient states her  understanding and wishes to proceed.   H&P reviewed and patient re-examined. No changes.

## 2020-12-06 NOTE — Op Note (Signed)
12/06/2020  12:27 PM  Patient:   Angela Suarez  Pre-Op Diagnosis:   Closed displaced fracture of greater tuberosity of left humerus.  Post-Op Diagnosis:   Same  Procedure:   1.  Open reduction and internal fixation of left greater tuberosity fracture. 2.  Open biceps tenodesis, left shoulder.  Anesthesia:   GET  Surgeon:   Pascal Lux, MD  Assistant:   Cameron Proud, PA-C  Findings:   As above.  The rotator cuff appeared to be intact, including the rotator interval.  Complications:   None  Fluids:   700 cc  Estimated blood loss:   75 cc  Tourniquet time:   None  Drains:   None  Closure:   Staples      Brief clinical note:   The patient is a 70 year old female who sustained above-noted injury last week when she apparently tripped down several steps in her home while taking her dog out for a walk. She presented to the emergency room where x-rays demonstrated a fracture dislocation of her left shoulder. The fracture was reduced by the ER provider. Follow-up x-rays in the office demonstrated displacement of the greater tuberosity fragment. The patient presents at this time for open reduction and internal fixation of a displaced left greater tuberosity fracture.  Procedure:   The patient was brought into the operating room and lain in the supine position. The patient then underwent general endotracheal intubation and anesthesia before being repositioned in the beach chair position using the beach chair positioner. The left shoulder and upper extremity were prepped with ChloraPrep solution before being draped sterilely. Preoperative antibiotics were administered. A timeout was performed to confirm the proper surgical site.   A standard anterior deltopectoral approach was made through an approximately 10 to 12 cm incision.  The incision was carried down through the subcutaneous tissues to expose the cephalic vein.  This was retracted laterally with the deltoid muscle and the  deltopectoral interval developed to provide access to the anterior aspect of the shoulder.  Abundant bursal tissues were debrided to better delineate the anatomy.  The greater tuberosity fragment was identified and mobilized by passing a #2 FiberWire through the rotator cuff tissue adjacent to the bony attachment.  Fracture hematoma was debrided using a combination of pickups, rongeurs, and irrigation.  The fracture was carefully reduced and temporarily secured using a bone-holding tenaculum.  The adequacy of reduction was verified fluoroscopically in several projections.  Initially the greater tuberosity fragment had of advanced to distally so it was repositioned into a more anatomic position and again stabilized temporarily.  Repeat fluoroscopic imaging demonstrated near anatomic reduction.  The fracture was stabilized using two Synthes 4.5 mm fully threaded cannulated cancellous screws with washers.  Again the adequacy of fixation and hardware position was verified fluoroscopically in several projections and found to be excellent.  One of the screws was felt to be too short and was replaced with a longer screw.  A second #2 FiberWire was passed through the supraspinatus tendon insertion fibers, and a third #2 FiberWire was passed beneath the two washers before fully tightening the two screws placed to secure the greater tuberosity fracture fragment.  All three of these FiberWire's were brought anteriorly and secured to the lesser tuberosity using a single [employer] Healicoil knotless RegeneSorb anchor.  Again the adequacy of fracture fixation and hardware position was verified fluoroscopically in AP as well as internal and externally rotated views and found to be excellent.  The  biceps tendon was secured to the adjacent pectoralis major tendon using several #0 Ethibond interrupted sutures.  More proximally, the tendon was secured in the groove with two more #0 Ethibond interrupted sutures before the  biceps tendon was released between these 2 points of fixation to effect at the biceps tenodesis.  The wound was copiously irrigated with sterile saline solution before the deltoid raphae was reapproximated using #0 Vicryl interrupted sutures. The subcutaneous tissues were closed in two layers using 2-0 Vicryl interrupted sutures before the skin was closed using staples. A sterile occlusive dressing was applied to the wound before a Polar Care device was applied to the shoulder. The arm then was placed into a shoulder immobilizer. The patient was then awakened, extubated, and returned to the recovery room in satisfactory condition after tolerating the procedure well.

## 2020-12-06 NOTE — Anesthesia Postprocedure Evaluation (Signed)
Anesthesia Post Note  Patient: Angela Suarez  Procedure(s) Performed: OPEN REDUCTION INTERNAL FIXATION (ORIF) PROXIMAL HUMERUS GREATER TUBEROSITY FRACTURE. (Left: Shoulder)  Patient location during evaluation: PACU Anesthesia Type: General Level of consciousness: awake and alert Pain management: pain level controlled Vital Signs Assessment: post-procedure vital signs reviewed and stable Respiratory status: spontaneous breathing, nonlabored ventilation, respiratory function stable and patient connected to nasal cannula oxygen Cardiovascular status: blood pressure returned to baseline and stable Postop Assessment: no apparent nausea or vomiting Anesthetic complications: no   No notable events documented.   Last Vitals:  Vitals:   12/06/20 1400 12/06/20 1419  BP: (!) 170/83 (!) 156/94  Pulse: 79 87  Resp:    Temp: (!) 36.3 C (!) 36.1 C  SpO2: 95% 94%    Last Pain:  Vitals:   12/06/20 1419  TempSrc: Temporal  PainSc: 0-No pain                 Precious Haws Kao Conry

## 2020-12-06 NOTE — Anesthesia Procedure Notes (Signed)
Procedure Name: Intubation Date/Time: 12/06/2020 10:41 AM Performed by: Natasha Mead, CRNA Pre-anesthesia Checklist: Patient identified, Emergency Drugs available, Suction available and Patient being monitored Patient Re-evaluated:Patient Re-evaluated prior to induction Oxygen Delivery Method: Circle system utilized Preoxygenation: Pre-oxygenation with 100% oxygen Induction Type: IV induction Ventilation: Mask ventilation without difficulty Laryngoscope Size: McGraph and 3 Grade View: Grade I Tube type: Oral Tube size: 6.5 mm Number of attempts: 1 Airway Equipment and Method: Stylet and Oral airway Placement Confirmation: ETT inserted through vocal cords under direct vision, positive ETCO2 and breath sounds checked- equal and bilateral Secured at: 18 cm Tube secured with: Tape Dental Injury: Teeth and Oropharynx as per pre-operative assessment

## 2020-12-06 NOTE — Discharge Instructions (Addendum)
Orthopedic discharge instructions: May shower with intact OpSite dressing. Apply ice frequently to shoulder or use Polar Care device. Take ibuprofen 600-800 mg TID with meals for 7-10 days, then as necessary. Take hydrocodone as prescribed when needed.  May supplement with ES Tylenol if necessary. Keep shoulder immobilizer on at all times except may remove for bathing purposes. Follow-up in 10-14 days or as scheduled.   AMBULATORY SURGERY  DISCHARGE INSTRUCTIONS   The drugs that you were given will stay in your system until tomorrow so for the next 24 hours you should not:  Drive an automobile Make any legal decisions Drink any alcoholic beverage   You may resume regular meals tomorrow.  Today it is better to start with liquids and gradually work up to solid foods.  You may eat anything you prefer, but it is better to start with liquids, then soup and crackers, and gradually work up to solid foods.   Please notify your doctor immediately if you have any unusual bleeding, trouble breathing, redness and pain at the surgery site, drainage, fever, or pain not relieved by medication.    Your post-operative visit with Dr.                                       is: Date:                        Time:    Please call to schedule your post-operative visit.  Additional Instructions:

## 2020-12-06 NOTE — Transfer of Care (Signed)
Immediate Anesthesia Transfer of Care Note  Patient: CEIRA WILLOCKS  Procedure(s) Performed: OPEN REDUCTION INTERNAL FIXATION (ORIF) PROXIMAL HUMERUS GREATER TUBEROSITY FRACTURE. (Left: Shoulder)  Patient Location: PACU  Anesthesia Type:General  Level of Consciousness: awake, alert  and oriented  Airway & Oxygen Therapy: Patient Spontanous Breathing  Post-op Assessment: Report given to RN and Post -op Vital signs reviewed and stable  Post vital signs: stable  Last Vitals:  Vitals Value Taken Time  BP 162/77 12/06/20 1247  Temp    Pulse 81 12/06/20 1252  Resp 16 12/06/20 1252  SpO2 95 % 12/06/20 1252  Vitals shown include unvalidated device data.  Last Pain:  Vitals:   12/06/20 0915  TempSrc: Oral  PainSc: 2       Patients Stated Pain Goal: 0 ([id number])  Complications: No notable events documented.

## 2020-12-07 ENCOUNTER — Encounter: Payer: Self-pay | Admitting: Surgery

## 2020-12-13 ENCOUNTER — Ambulatory Visit: Payer: Medicare HMO | Admitting: Physical Therapy

## 2020-12-20 ENCOUNTER — Ambulatory Visit: Payer: Medicare HMO | Admitting: Physical Therapy

## 2020-12-27 ENCOUNTER — Ambulatory Visit: Payer: Medicare HMO | Admitting: Physical Therapy

## 2021-01-03 ENCOUNTER — Ambulatory Visit: Payer: Medicare HMO | Admitting: Physical Therapy

## 2021-04-09 ENCOUNTER — Other Ambulatory Visit: Payer: Self-pay | Admitting: *Deleted

## 2021-04-09 DIAGNOSIS — F17210 Nicotine dependence, cigarettes, uncomplicated: Secondary | ICD-10-CM

## 2021-04-09 DIAGNOSIS — Z87891 Personal history of nicotine dependence: Secondary | ICD-10-CM

## 2021-04-16 ENCOUNTER — Other Ambulatory Visit: Payer: Self-pay | Admitting: Internal Medicine

## 2021-04-16 DIAGNOSIS — Z1231 Encounter for screening mammogram for malignant neoplasm of breast: Secondary | ICD-10-CM

## 2021-04-17 ENCOUNTER — Ambulatory Visit
Admission: RE | Admit: 2021-04-17 | Discharge: 2021-04-17 | Disposition: A | Discharge status: Home / Self Care | Payer: Medicare HMO | Source: Ambulatory Visit | Attending: Acute Care | Admitting: Acute Care

## 2021-04-17 ENCOUNTER — Other Ambulatory Visit: Payer: Self-pay

## 2021-04-17 DIAGNOSIS — Z87891 Personal history of nicotine dependence: Secondary | ICD-10-CM | POA: Diagnosis present

## 2021-04-17 DIAGNOSIS — F17210 Nicotine dependence, cigarettes, uncomplicated: Secondary | ICD-10-CM | POA: Diagnosis not present

## 2021-04-18 ENCOUNTER — Encounter: Payer: Self-pay | Admitting: Otolaryngology

## 2021-04-19 ENCOUNTER — Other Ambulatory Visit: Payer: Self-pay | Admitting: Acute Care

## 2021-04-19 DIAGNOSIS — Z87891 Personal history of nicotine dependence: Secondary | ICD-10-CM

## 2021-04-19 DIAGNOSIS — F17210 Nicotine dependence, cigarettes, uncomplicated: Secondary | ICD-10-CM

## 2021-05-27 ENCOUNTER — Emergency Department
Admission: EM | Admit: 2021-05-27 | Discharge: 2021-05-27 | Disposition: A | Discharge status: Home / Self Care | Payer: Medicare HMO | Attending: Emergency Medicine | Admitting: Emergency Medicine

## 2021-05-27 ENCOUNTER — Emergency Department: Payer: Medicare HMO

## 2021-05-27 ENCOUNTER — Other Ambulatory Visit: Payer: Self-pay

## 2021-05-27 DIAGNOSIS — R519 Headache, unspecified: Secondary | ICD-10-CM | POA: Diagnosis not present

## 2021-05-27 DIAGNOSIS — M79602 Pain in left arm: Secondary | ICD-10-CM | POA: Diagnosis not present

## 2021-05-27 DIAGNOSIS — R079 Chest pain, unspecified: Secondary | ICD-10-CM | POA: Diagnosis not present

## 2021-05-27 LAB — TROPONIN I (HIGH SENSITIVITY)
Troponin I (High Sensitivity): 4 ng/L
Troponin I (High Sensitivity): 5 ng/L (ref <18)

## 2021-05-27 LAB — BASIC METABOLIC PANEL WITH GFR
Anion gap: 6 (ref 5–15)
BUN: 24 mg/dL — ABNORMAL HIGH (ref 8–23)
CO2: 27 mmol/L (ref 22–32)
Calcium: 8.7 mg/dL — ABNORMAL LOW (ref 8.9–10.3)
Chloride: 104 mmol/L (ref 98–111)
Creatinine, Ser: 0.89 mg/dL (ref 0.44–1.00)
GFR, Estimated: >60 mL/min
Glucose, Bld: 86 mg/dL (ref 70–99)
Potassium: 4 mmol/L (ref 3.5–5.1)
Sodium: 137 mmol/L (ref 135–145)

## 2021-05-27 LAB — CBC
HCT: 42.6 % (ref 36.0–46.0)
Hemoglobin: 13.3 g/dL (ref 12.0–15.0)
MCH: 29.6 pg (ref 26.0–34.0)
MCHC: 31.2 g/dL (ref 30.0–36.0)
MCV: 94.7 fL (ref 80.0–100.0)
Platelets: 152 10*3/uL (ref 150–400)
RBC: 4.5 MIL/uL (ref 3.87–5.11)
RDW: 14.2 % (ref 11.5–15.5)
WBC: 6.6 10*3/uL (ref 4.0–10.5)
nRBC: 0 % (ref 0.0–0.2)

## 2021-05-27 MED ORDER — ACETAMINOPHEN 325 MG PO TABS
650.0000 mg | ORAL_TABLET | Freq: Once | ORAL | Status: AC
  Administered 2021-05-27: 650 mg via ORAL
  Filled 2021-05-27: qty 2

## 2021-05-27 MED ORDER — BUTALBITAL-APAP-CAFFEINE 50-325-40 MG PO TABS: 1.0000 | ORAL_TABLET | Freq: Once | ORAL | Status: DC

## 2021-05-27 NOTE — ED Triage Notes (Signed)
Pt states was lying in bed this morning she began to have left shoulder pain that ran up into her left neck and around left chest. Pt states it lasted approx 5 minutes and she felt nauseted. Pt states pain is gone now but her face feels "tingly" and she is now having lower back pain.

## 2021-05-27 NOTE — ED Provider Notes (Signed)
[facility] Provider Note    Event Date/Time   First MD Initiated Contact with Patient 05/27/21 [phone]     (approximate)   History   Chest Pain   HPI  Angela Suarez is a 71 y.o. female  who, per outpatient clinic note dated 02/05/21 has history of HTN, COPD, who presents to the emergency department today after an episode of left arm, chest and facial pain. The patient states that the pain woke her up from sleep around 4 am. The pain started in her left arm and then worked its way to her chest and then up to her face and cheek. This was accompanied by a headache. At the time of my exam she only has some residual headache. The pain was not accompanied by any SOB or diaphoresis. The patient has not had any recent fevers. Does now have some chronic left arm pain after surgery performed on it a few months ago but today's symptoms felt different. The patient did take 324 of aspirin at home.   Physical Exam   Triage Vital Signs: ED Triage Vitals  Enc Vitals Group     BP 05/27/21 0707 124/76     Pulse Rate 05/27/21 0707 63     Resp 05/27/21 0703 16     Temp 05/27/21 0707 98.2 F (36.8 C)     Temp src --      SpO2 05/27/21 0707 95 %     Weight 05/27/21 0702 145 lb (65.8 kg)     Height 05/27/21 0702 5\' 3"  (1.6 m)     Head Circumference --      Peak Flow --      Pain Score 05/27/21 0702 4     Pain Loc --      Pain Edu? --      Excl. in [place]? --     Most recent vital signs: Vitals:   05/27/21 0703 05/27/21 0707  BP:  124/76  Pulse:  63  Resp: 16 18  Temp:  98.2 F (36.8 C)  SpO2:  95%    General: Awake, no distress.  CV:  Good peripheral perfusion. No murmurs Resp:  Normal effort. Lungs clear to auscultation Abd:  No distention.  MSK:  NV intact in bilateral upper extremities. No discoloration.   ED Results / Procedures / Treatments   Labs (all labs ordered are listed, but only abnormal results are displayed) Labs Reviewed  BASIC METABOLIC PANEL  - Abnormal; Notable for the following components:      Result Value   BUN 24 (*)    Calcium 8.7 (*)    All other components within normal limits  CBC  TROPONIN I (HIGH SENSITIVITY)  TROPONIN I (HIGH SENSITIVITY)     EKG  I, Nance Pear, attending physician, personally viewed and interpreted this EKG  EKG Time: 0703 Rate: 64 Rhythm: normal sinus rhythm with sinus arrhythmia Axis: left axis deviation Intervals: qtc 435 QRS: narrow ST changes: no st elevation Impression: abnormal ekg  RADIOLOGY CXR I independently interpreted and visualized the CXR. My interpretation: No PNA/pneumothorax Radiology interpretation:  IMPRESSION:  No radiographic evidence of acute cardiopulmonary process.     PROCEDURES:  Critical Care performed: No  Procedures   MEDICATIONS ORDERED IN ED: Medications - No data to display   IMPRESSION / MDM / Gutierrez / ED COURSE  I reviewed the triage vital signs and the nursing notes.  Differential diagnosis includes, but is not limited to, pneumonia, pneumothorax, dissection, PE, ACS, muscular skeletal pain.  Patient presented to the emergency department today because of concerns for an episode of chest pain.  By the time my exam the patient had felt improvement.  The patient states that she only had of some residual headache.  Patient was given Tylenol which improved her headache.  Patient's blood work here with negative troponin x2.  No leukocytosis or concerning anemia.  Electrolytes without any significant abnormalities.  Chest x-ray without pneumonia or pneumothorax.  Given rapid improvement I have low concern for dissection at this time low concern for PE as well.  Given patient's improvement at this time do not think admission is necessary.  Did discuss with patient portance of follow-up and encouraged her to return for any worsening symptoms.    FINAL CLINICAL IMPRESSION(S) / ED DIAGNOSES   Final  diagnoses:  Nonspecific chest pain     Note:  This document was prepared using Dragon voice recognition software and may include unintentional dictation errors.    Nance Pear, MD 05/27/21 [phone]

## 2021-05-27 NOTE — Discharge Instructions (Signed)
Please seek medical attention for any high fevers, chest pain, shortness of breath, change in behavior, persistent vomiting, bloody stool or any other new or concerning symptoms.  

## 2021-05-27 NOTE — ED Notes (Signed)
Pt presents to ED with c/o of chest pain and L arm pain that woke her up from her sleep. Pt states shoulder/rotator cuff surgery in Sept to that shoulder. Pt states she normally does not have pain in that area. Pt states pain radiated from her chest to L arm and L side of face. Pt denies any episodes of diaphoresis or N/V. Pt states pain is now 1/10. Pt states "I feel a lot better now". Pt states she took 325 ASA PTA.  Pt states HX of stroke about 8-10 years ago. Pt is A&Ox4. NAD noted.   Pt states HX of hypertension. Husband at bedside.

## 2021-05-27 NOTE — ED Notes (Signed)
D/C and reasons to return to ED discsused with pt, pt verbalzied undertsanidng. NAD noted. Pt amabultoy with steady gait on D/C.

## 2021-05-30 ENCOUNTER — Ambulatory Visit
Admission: RE | Admit: 2021-05-30 | Discharge: 2021-05-30 | Disposition: A | Discharge status: Home / Self Care | Payer: Medicare HMO | Source: Ambulatory Visit | Attending: Internal Medicine | Admitting: Internal Medicine

## 2021-05-30 ENCOUNTER — Other Ambulatory Visit: Payer: Self-pay

## 2021-05-30 DIAGNOSIS — Z1231 Encounter for screening mammogram for malignant neoplasm of breast: Secondary | ICD-10-CM | POA: Diagnosis present

## 2021-06-12 ENCOUNTER — Other Ambulatory Visit (HOSPITAL_COMMUNITY): Payer: Self-pay | Admitting: Internal Medicine

## 2021-06-12 DIAGNOSIS — R079 Chest pain, unspecified: Secondary | ICD-10-CM

## 2021-06-12 DIAGNOSIS — I2510 Atherosclerotic heart disease of native coronary artery without angina pectoris: Secondary | ICD-10-CM

## 2021-06-12 DIAGNOSIS — I700 Atherosclerosis of aorta: Secondary | ICD-10-CM

## 2021-06-13 ENCOUNTER — Other Ambulatory Visit (HOSPITAL_COMMUNITY): Payer: Self-pay | Admitting: *Deleted

## 2021-06-13 ENCOUNTER — Telehealth (HOSPITAL_COMMUNITY): Payer: Self-pay | Admitting: *Deleted

## 2021-06-13 MED ORDER — METOPROLOL TARTRATE 100 MG PO TABS: ORAL_TABLET | ORAL | 0 refills | Status: DC

## 2021-06-13 NOTE — Telephone Encounter (Signed)
Reaching out to patient to offer assistance regarding upcoming cardiac imaging study; pt verbalizes understanding of appt date/time, parking situation and where to check in, pre-test NPO status and medications ordered, and verified current allergies; name and call back number provided for further questions should they arise ? ?Gordy Clement RN Navigator Cardiac Imaging ?[facility] ?[phone] office ?[phone] cell ? ?Per chart review by Dr. Garen Lah, patient to take '100mg'$  metoprolol tartrate two hours prior to her cardiac CT scan.  Confirm with patient her pharmacy and sent medication.  She verbalize understanding. ?

## 2021-06-17 ENCOUNTER — Other Ambulatory Visit: Payer: Self-pay

## 2021-06-17 ENCOUNTER — Ambulatory Visit
Admission: RE | Admit: 2021-06-17 | Discharge: 2021-06-17 | Disposition: A | Discharge status: Home / Self Care | Payer: Medicare HMO | Source: Ambulatory Visit | Attending: Internal Medicine | Admitting: Internal Medicine

## 2021-06-17 DIAGNOSIS — I700 Atherosclerosis of aorta: Secondary | ICD-10-CM | POA: Insufficient documentation

## 2021-06-17 DIAGNOSIS — R079 Chest pain, unspecified: Secondary | ICD-10-CM | POA: Diagnosis not present

## 2021-06-17 DIAGNOSIS — I2510 Atherosclerotic heart disease of native coronary artery without angina pectoris: Secondary | ICD-10-CM | POA: Diagnosis not present

## 2021-06-17 MED ORDER — IOHEXOL 350 MG/ML SOLN
75.0000 mL | Freq: Once | INTRAVENOUS | Status: AC | PRN
  Administered 2021-06-17: 75 mL via INTRAVENOUS

## 2021-06-17 MED ORDER — NITROGLYCERIN 0.4 MG SL SUBL
0.8000 mg | SUBLINGUAL_TABLET | Freq: Once | SUBLINGUAL | Status: AC
  Administered 2021-06-17: 0.8 mg via SUBLINGUAL

## 2021-06-17 NOTE — Progress Notes (Signed)
Patient ID: Angela Suarez, female   DOB: 09-30-1950, 71 y.o.   MRN: [id number] ? ?Patient tolerated cardiac CT Scan without incident.  Patient educated about the importance of PO fluids to flush out contrast. She verbalized understanding. Patient ambulated department with a steady gait. ?

## 2021-08-06 ENCOUNTER — Other Ambulatory Visit: Payer: Self-pay | Admitting: Internal Medicine

## 2021-08-06 DIAGNOSIS — R1011 Right upper quadrant pain: Secondary | ICD-10-CM

## 2021-08-13 ENCOUNTER — Ambulatory Visit
Admission: RE | Admit: 2021-08-13 | Discharge: 2021-08-13 | Disposition: A | Discharge status: Home / Self Care | Payer: Medicare HMO | Source: Ambulatory Visit | Attending: Internal Medicine | Admitting: Internal Medicine

## 2021-08-13 DIAGNOSIS — R1011 Right upper quadrant pain: Secondary | ICD-10-CM

## 2021-10-25 ENCOUNTER — Other Ambulatory Visit: Payer: Self-pay | Admitting: Student

## 2021-10-25 DIAGNOSIS — R1314 Dysphagia, pharyngoesophageal phase: Secondary | ICD-10-CM

## 2021-11-04 ENCOUNTER — Ambulatory Visit
Admission: RE | Admit: 2021-11-04 | Discharge: 2021-11-04 | Disposition: A | Discharge status: Home / Self Care | Payer: Medicare HMO | Source: Ambulatory Visit | Attending: Student | Admitting: Student

## 2021-11-04 DIAGNOSIS — R1314 Dysphagia, pharyngoesophageal phase: Secondary | ICD-10-CM | POA: Diagnosis present

## 2021-11-04 NOTE — Therapy (Signed)
[facility] [place], [place], [address] Phone: [phone]   Fax:     Modified Barium Swallow  Patient Details  Name: Angela Suarez MRN: [id number] Date of Birth: 20-May-1950 No data recorded  Encounter Date: 11/04/2021   End of Session - 11/04/21 1359     Visit Number 1    Number of Visits 1    Date for SLP Re-Evaluation 11/04/21    SLP Start Time 1245    SLP Stop Time  1325    SLP Time Calculation (min) 40 min    Activity Tolerance Patient tolerated treatment well             Past Medical History:  Diagnosis Date   Allergic rhinitis    Anxiety    Arthritis    Cancer ([place])    skin   Cervical disc disease    COPD (chronic obstructive pulmonary disease) ([place])    Depression    GERD (gastroesophageal reflux disease)    Hypertension    IBS (irritable bowel syndrome)    Lumbar disc disease    Lupus anticoagulant positive    Multiple sclerosis ([place])    Stroke ([place])    06/2012   Thoracic disc disease     Past Surgical History:  Procedure Laterality Date   ABDOMINAL HYSTERECTOMY     BREAST EXCISIONAL BIOPSY Right 1990   x 2, neg   colon polyps     colonoscipy     COLONOSCOPY WITH PROPOFOL N/A 09/15/2016   Procedure: COLONOSCOPY WITH PROPOFOL;  Surgeon: Manya Silvas, MD;  Location: [facility] ENDOSCOPY;  Service: Endoscopy;  Laterality: N/A;   ORIF HUMERUS FRACTURE Left 12/06/2020   Procedure: OPEN REDUCTION INTERNAL FIXATION (ORIF) PROXIMAL HUMERUS GREATER TUBEROSITY FRACTURE.;  Surgeon: Corky Mull, MD;  Location: [facility] ORS;  Service: Orthopedics;  Laterality: Left;   POLYPECTOMY Right 1990   throat polyp removed by Dr Ladene Artist.    TONSILLECTOMY      There were no vitals filed for this visit.     11/04/21 1400  SLP Visit Information  SLP Received On 11/04/21  Subjective  Subjective Pt reports coughing up a piece of fried chicken crust  Pain Assessment  Pain Assessment No/denies pain   General Information  Date of Onset 11/04/21  HPI Pt is a 71 y.o. female with past medical history including secondary progressive MS (dx 2003), lupus, IBS, HTN, COPD, arthritis, anxiety, and CVA (2014).  Barium swallow 07/08/2016: "Small sliding hiatal hernia with small B ring. No evidence of obstruction. Standardized barium tablet passed normally. No reflux." ACDF C5-6  Type of Study MBS-Modified Barium Swallow Study  Previous Swallow Assessment see HPI  Diet Prior to this Study Regular;Thin liquids  Temperature Spikes Noted No  Respiratory Status Room air  History of Recent Intubation No  Behavior/Cognition Alert;Cooperative;Pleasant mood  Oral Cavity Assessment WFL  Oral Care Completed by SLP No  Oral Cavity - Dentition Adequate natural dentition  Vision Functional for self feeding  Self-Feeding Abilities Able to feed self  Patient Positioning Upright in chair  Baseline Vocal Quality Normal  Volitional Cough Strong  Volitional Swallow Able to elicit  Anatomy WFL ([id number])  Pharyngeal Secretions Not observed secondary MBS  Oral Motor/Sensory Function  Overall Oral Motor/Sensory Function WFL  Oral Preparation/Oral Phase  Oral Phase Impaired  Oral - Nectar  Oral - Nectar Cup Premature spillage  Oral - Thin  Oral - Thin Teaspoon Premature spillage  Oral -  Thin Cup Premature spillage  Oral - Thin Straw Premature spillage  Pharyngeal Phase  Pharyngeal Phase Impaired  Pharyngeal - Honey  Pharyngeal- Honey Teaspoon WFL  Pharyngeal Material does not enter airway  Pharyngeal - Nectar  Pharyngeal- Nectar Teaspoon WFL  Pharyngeal Material does not enter airway  Pharyngeal- Nectar Cup Delayed swallow initiation-pyriform sinuses;Pharyngeal residue - pyriform  Pharyngeal Material does not enter airway  Pharyngeal - Thin  Pharyngeal- Thin Teaspoon Delayed swallow initiation-pyriform sinuses;Pharyngeal residue - pyriform  Pharyngeal Material does not enter airway  Pharyngeal- Thin Cup  Delayed swallow initiation-pyriform sinuses;Pharyngeal residue - valleculae  Pharyngeal Material does not enter airway  Pharyngeal- Thin Straw Delayed swallow initiation-pyriform sinuses;Pharyngeal residue - pyriform;Penetration/Aspiration before swallow;Trace aspiration  Pharyngeal Material enters airway, passes BELOW cords and not ejected out despite cough attempt by patient  Pharyngeal - Solids  Pharyngeal- Puree WFL  Pharyngeal Material does not enter airway  Pharyngeal- Regular WFL  Pharyngeal Material does not enter airway  Cervical Esophageal Phase  Cervical Esophageal Phase Impaired (reduced amplitude of opening of the PE segment with appearance of slow clearance with min residual in the cervical esophagus)  Clinical Impression  Clinical Impression Patient presents with mild oropharyngeal dysphagia. Oral stage is characterized by premature spillage to the pharynx with thin liquids, resulting in trace aspiration x1 with straw sips. Swallow initiation occurs at the level of the valleculae for solids, pyriforms for liquids. Mild-moderate pyriform sinus residue reduces with cued dry swallow. Presence of ACDF hardware C5-6 noted with protrusion into the pharyngeal space  is felt to contribute to reduced amplitude of opening of the pharyngoesophageal segment (vs hyolaryngeal weakness). Appearance of slow, incomplete clearance through the cervical esophagus; further esophageal assessment could be considered. Aside from 1 instance of sensed, trace aspiration with straw sip of thin, there was no other penetration or aspiration observed during the study. An esophageal sweep was performed in the anterior-posterior projection with nectar thick liquids and was unremarkable. Patient was educated on study findings and recommendations including: small, single sips of thin liquids, avoid straw use if this is noted to exacerbate coughing with liquids. Swallow twice for clearance of residue. Pt may benefit from  alternating solids and liquids, upright posture to improve esophageal clearance and reduce pt's reported mid-sternum foreign body sensation. Recommend continue regular diet with thin liquids, meds whole with liquid or can attempt whole with applesauce or pudding to aid transfer if necessary. Pt was provided with handout with written recommendations listed above. No further skilled ST recommended at this time.  SLP Visit Diagnosis Dysphagia, oropharyngeal phase (R13.12);Dysphagia, pharyngoesophageal phase (R13.14)  Impact on safety and function Mild aspiration risk  Swallow Evaluation Recommendations  Recommended Consults Consider GI evaluation;Consider esophageal assessment  SLP Diet Recommendations Regular solids;Thin liquid  Liquid Administration via Cup;No straw  Medication Administration Whole meds with liquid  Supervision Patient able to self feed  Compensations Slow rate;Small sips/bites;Multiple dry swallows after each bite/sip  Postural Changes Remain semi-upright after after feeds/meals (Comment);Seated upright at 90 degrees  Treatment Plan  Oral Care Recommendations Oral care QID  Treatment Recommendations No treatment recommended at this time  Follow Up Recommendations No SLP follow up  Individuals Consulted  Consulted and Agree with Results and Recommendations Patient  Report Sent to  Referring physician  Progression Toward Goals  Progression toward goals Goals met, education completed, patient discharged from SLP  SLP Time Calculation  SLP Start Time (ACUTE ONLY) 1245  SLP Stop Time (ACUTE ONLY) 1325  SLP Time Calculation (min) (  ACUTE ONLY) 40 min  SLP Evaluations  $ SLP Speech Visit 1 Visit  SLP Evaluations  $Outpatient MBS Swallow 1 Procedure   Dysphagia, pharyngoesophageal - Plan: DG SWALLOW FUNC OP MEDICARE SPEECH PATH, DG SWALLOW FUNC OP MEDICARE SPEECH PATH        Problem List There are no problems to display for this patient.  Deneise Lever, [place],  CCC-SLP Speech-Language Pathologist [phone]  Aliene Altes, [place] 11/04/2021, 5:13 PM  [facility] [place], [place], [address] Phone: [phone]   Fax:     Name: Angela Suarez MRN: [id number] Date of Birth: 1950-08-12

## 2021-12-19 ENCOUNTER — Other Ambulatory Visit: Payer: Self-pay | Admitting: Internal Medicine

## 2021-12-19 DIAGNOSIS — R109 Unspecified abdominal pain: Secondary | ICD-10-CM

## 2021-12-26 ENCOUNTER — Ambulatory Visit
Admission: RE | Admit: 2021-12-26 | Discharge: 2021-12-26 | Disposition: A | Discharge status: Home / Self Care | Payer: Medicare HMO | Source: Ambulatory Visit | Attending: Internal Medicine | Admitting: Internal Medicine

## 2021-12-26 DIAGNOSIS — R109 Unspecified abdominal pain: Secondary | ICD-10-CM | POA: Insufficient documentation

## 2022-01-01 ENCOUNTER — Ambulatory Visit: Payer: Medicare HMO | Admitting: Urology

## 2022-01-01 ENCOUNTER — Encounter: Payer: Self-pay | Admitting: Urology

## 2022-01-01 VITALS — BP 117/80 | HR 83 | Ht 62.0 in | Wt 137.0 lb

## 2022-01-01 DIAGNOSIS — R102 Pelvic and perineal pain: Secondary | ICD-10-CM | POA: Diagnosis not present

## 2022-01-01 DIAGNOSIS — Z87440 Personal history of urinary (tract) infections: Secondary | ICD-10-CM

## 2022-01-01 DIAGNOSIS — R300 Dysuria: Secondary | ICD-10-CM

## 2022-01-01 DIAGNOSIS — R399 Unspecified symptoms and signs involving the genitourinary system: Secondary | ICD-10-CM

## 2022-01-01 DIAGNOSIS — R82998 Other abnormal findings in urine: Secondary | ICD-10-CM

## 2022-01-01 DIAGNOSIS — R3129 Other microscopic hematuria: Secondary | ICD-10-CM

## 2022-01-01 DIAGNOSIS — N3001 Acute cystitis with hematuria: Secondary | ICD-10-CM

## 2022-01-01 DIAGNOSIS — R319 Hematuria, unspecified: Secondary | ICD-10-CM

## 2022-01-01 LAB — URINALYSIS, COMPLETE
Bilirubin, UA: NEGATIVE
Glucose, UA: NEGATIVE
Nitrite, UA: POSITIVE — AB
Specific Gravity, UA: 1.02 (ref 1.005–1.030)
Urobilinogen, Ur: 0.2 mg/dL (ref 0.2–1.0)
pH, UA: 5 (ref 5.0–7.5)

## 2022-01-01 LAB — MICROSCOPIC EXAMINATION: RBC, Urine: >30 /hpf — AB (ref 0–2)

## 2022-01-01 MED ORDER — CEPHALEXIN 500 MG PO CAPS: 500.0000 mg | ORAL_CAPSULE | Freq: Two times a day (BID) | ORAL | 0 refills | Status: DC

## 2022-01-01 NOTE — Progress Notes (Signed)
01/01/22 2:39 PM   Angela Suarez [birth year] [id number]  CC: Urinary symptoms, dysuria, microscopic hematuria  HPI: 71 year old female referred for 3 weeks of intermittent dysuria, urethral pain, few episodes of dark-colored urine, as well as microscopic hematuria with 60 RBCs seen on UA but negative urine culture.  A CT stone protocol was ordered by PCP which showed no obvious etiology of her symptoms.  Her urinalysis today appears grossly infected with 11-30 WBCs, greater than 30 RBCs, 0-10 epithelial cells, moderate bacteria, no yeast, nitrite positive, trace leukocytes.  She denies any definitive gross hematuria.  She does think this feels similar to previous UTIs.  She has an active smoker.   PMH: Past Medical History:  Diagnosis Date   Allergic rhinitis    Anxiety    Arthritis    Cancer ([place])    skin   Cervical disc disease    COPD (chronic obstructive pulmonary disease) (HCC)    Depression    GERD (gastroesophageal reflux disease)    Hypertension    IBS (irritable bowel syndrome)    Lumbar disc disease    Lupus anticoagulant positive    Multiple sclerosis ([place])    Stroke ([place])    06/2012   Thoracic disc disease     Surgical History: Past Surgical History:  Procedure Laterality Date   ABDOMINAL HYSTERECTOMY     BREAST EXCISIONAL BIOPSY Right 1990   x 2, neg   colon polyps     colonoscipy     COLONOSCOPY WITH PROPOFOL N/A 09/15/2016   Procedure: COLONOSCOPY WITH PROPOFOL;  Surgeon: Manya Silvas, MD;  Location: [facility] ENDOSCOPY;  Service: Endoscopy;  Laterality: N/A;   ORIF HUMERUS FRACTURE Left 12/06/2020   Procedure: OPEN REDUCTION INTERNAL FIXATION (ORIF) PROXIMAL HUMERUS GREATER TUBEROSITY FRACTURE.;  Surgeon: Corky Mull, MD;  Location: [facility] ORS;  Service: Orthopedics;  Laterality: Left;   POLYPECTOMY Right 1990   throat polyp removed by Dr Ladene Artist.    TONSILLECTOMY       Family History: Family History  Problem Relation Age of Onset   Breast cancer  Mother 34   Cancer Mother    Breast cancer Paternal Aunt 32   Breast cancer Paternal Aunt 58   Cancer Father     Social History:  reports that she has been smoking. She has been exposed to tobacco smoke. She has never used smokeless tobacco. She reports that she does not drink alcohol and does not use drugs.  Physical Exam: BP 117/80   Pulse 83   Ht '5\' 2"'$  (1.575 m)   Wt 137 lb (62.1 kg)   BMI 25.06 kg/m    Constitutional:  Alert and oriented, No acute distress. Cardiovascular: No clubbing, cyanosis, or edema. Respiratory: Normal respiratory effort, no increased work of breathing. GI: Abdomen is soft, nontender, nondistended, no abdominal masses  Laboratory Data: Reviewed, see HPI  Pertinent Imaging: I have personally viewed and interpreted the CT stone protocol dated 12/26/2021 with no evidence of ureteral stones or hydronephrosis, bladder decompressed.  Assessment & Plan:   71 year old female with 3 weeks of pelvic pain, urethral pain, dysuria, significant microscopic hematuria on prior UA with PCP but negative culture.  Urinalysis today appears grossly infected and will send for culture.  We will treat as suspected UTI, but low threshold to pursue cystoscopy if she has persistent symptoms and follow-up or persistent microscopic hematuria, and recommend repeating a UA to confirm clearance of microscopic blood with the significant degree on prior UA.  -Keflex  500 mg twice daily for suspected UTI, follow-up cultures -Follow-up in 3 weeks for repeat symptom check, repeat UA, pursue cystoscopy if persistent microscopic hematuria or persistent symptoms  Nickolas Madrid, MD 01/01/2022  [place] [address] [place], [place] [address] [phone]

## 2022-01-04 LAB — CULTURE, URINE COMPREHENSIVE

## 2022-01-06 ENCOUNTER — Telehealth: Payer: Self-pay | Admitting: Urology

## 2022-01-06 NOTE — Telephone Encounter (Signed)
Called and spoke with patient , she said that she has  finished the Kelflex that she was given  today , however she is still having burning with urination and some abd pain but not sure if this related. Pt wants know if she needs another antibiotic   ,please advise.

## 2022-01-06 NOTE — Telephone Encounter (Signed)
Pt called to let us know she isn't feeling any better after taking Keflex over the weekend.  U/A and Culture were done 9/27.

## 2022-01-07 ENCOUNTER — Other Ambulatory Visit: Payer: Self-pay

## 2022-01-07 MED ORDER — DOXYCYCLINE HYCLATE 100 MG PO CAPS: 100.0000 mg | ORAL_CAPSULE | Freq: Two times a day (BID) | ORAL | 0 refills | Status: DC

## 2022-01-07 NOTE — Telephone Encounter (Signed)
Called and spoke with patient informed pt that we have sent antibiotic to pharmacy for her to being to take she said that notice last night that the pain has got a little better, she also said that she wasn't  going take any more of the AZO because it was to much for her to take. Pt said that she going pick up rx and being taking medication. Advised pt that we are still waiting on culture  for patient.

## 2022-01-08 ENCOUNTER — Ambulatory Visit: Payer: Self-pay | Admitting: Urology

## 2022-01-08 LAB — MYCOPLASMA / UREAPLASMA CULTURE
Mycoplasma hominis Culture: NEGATIVE
Ureaplasma urealyticum: NEGATIVE

## 2022-01-29 ENCOUNTER — Ambulatory Visit: Payer: Medicare HMO | Admitting: Urology

## 2022-01-30 ENCOUNTER — Ambulatory Visit: Payer: Medicare HMO | Admitting: Urology

## 2022-03-21 ENCOUNTER — Other Ambulatory Visit: Payer: Self-pay | Admitting: Neurology

## 2022-03-21 DIAGNOSIS — G35 Multiple sclerosis: Secondary | ICD-10-CM

## 2022-04-03 ENCOUNTER — Other Ambulatory Visit: Payer: Self-pay | Admitting: Internal Medicine

## 2022-04-03 DIAGNOSIS — N6320 Unspecified lump in the left breast, unspecified quadrant: Secondary | ICD-10-CM

## 2022-04-17 ENCOUNTER — Ambulatory Visit: Payer: Medicare HMO | Admitting: Urology

## 2022-04-17 ENCOUNTER — Ambulatory Visit
Admission: RE | Admit: 2022-04-17 | Discharge: 2022-04-17 | Disposition: A | Discharge status: Home / Self Care | Payer: Medicare HMO | Source: Ambulatory Visit | Attending: Internal Medicine | Admitting: Internal Medicine

## 2022-04-17 DIAGNOSIS — F17210 Nicotine dependence, cigarettes, uncomplicated: Secondary | ICD-10-CM | POA: Diagnosis present

## 2022-04-17 DIAGNOSIS — Z122 Encounter for screening for malignant neoplasm of respiratory organs: Secondary | ICD-10-CM | POA: Insufficient documentation

## 2022-04-17 DIAGNOSIS — N6320 Unspecified lump in the left breast, unspecified quadrant: Secondary | ICD-10-CM | POA: Insufficient documentation

## 2022-04-17 DIAGNOSIS — I700 Atherosclerosis of aorta: Secondary | ICD-10-CM | POA: Insufficient documentation

## 2022-04-17 DIAGNOSIS — N6325 Unspecified lump in the left breast, overlapping quadrants: Secondary | ICD-10-CM | POA: Diagnosis not present

## 2022-04-17 DIAGNOSIS — Z87891 Personal history of nicotine dependence: Secondary | ICD-10-CM

## 2022-04-17 DIAGNOSIS — I2510 Atherosclerotic heart disease of native coronary artery without angina pectoris: Secondary | ICD-10-CM | POA: Insufficient documentation

## 2022-04-17 DIAGNOSIS — J432 Centrilobular emphysema: Secondary | ICD-10-CM | POA: Insufficient documentation

## 2022-04-18 ENCOUNTER — Other Ambulatory Visit: Payer: Self-pay | Admitting: Acute Care

## 2022-04-18 DIAGNOSIS — Z87891 Personal history of nicotine dependence: Secondary | ICD-10-CM

## 2022-04-18 DIAGNOSIS — F17210 Nicotine dependence, cigarettes, uncomplicated: Secondary | ICD-10-CM

## 2022-04-18 DIAGNOSIS — Z122 Encounter for screening for malignant neoplasm of respiratory organs: Secondary | ICD-10-CM

## 2022-05-07 ENCOUNTER — Ambulatory Visit: Payer: Medicare HMO | Admitting: Anesthesiology

## 2022-05-07 ENCOUNTER — Ambulatory Visit
Admission: RE | Admit: 2022-05-07 | Discharge: 2022-05-07 | Disposition: A | Discharge status: Home / Self Care | Payer: Medicare HMO | Attending: Internal Medicine | Admitting: Internal Medicine

## 2022-05-07 ENCOUNTER — Encounter
Admission: RE | Disposition: A | Discharge status: Home / Self Care | Payer: Self-pay | Source: Home / Self Care | Attending: Internal Medicine

## 2022-05-07 ENCOUNTER — Encounter: Payer: Self-pay | Admitting: Internal Medicine

## 2022-05-07 DIAGNOSIS — J309 Allergic rhinitis, unspecified: Secondary | ICD-10-CM | POA: Insufficient documentation

## 2022-05-07 DIAGNOSIS — Z8673 Personal history of transient ischemic attack (TIA), and cerebral infarction without residual deficits: Secondary | ICD-10-CM | POA: Insufficient documentation

## 2022-05-07 DIAGNOSIS — I10 Essential (primary) hypertension: Secondary | ICD-10-CM | POA: Diagnosis not present

## 2022-05-07 DIAGNOSIS — J449 Chronic obstructive pulmonary disease, unspecified: Secondary | ICD-10-CM | POA: Insufficient documentation

## 2022-05-07 DIAGNOSIS — Z1211 Encounter for screening for malignant neoplasm of colon: Secondary | ICD-10-CM | POA: Insufficient documentation

## 2022-05-07 DIAGNOSIS — K2970 Gastritis, unspecified, without bleeding: Secondary | ICD-10-CM | POA: Diagnosis not present

## 2022-05-07 DIAGNOSIS — D124 Benign neoplasm of descending colon: Secondary | ICD-10-CM | POA: Diagnosis not present

## 2022-05-07 DIAGNOSIS — K5730 Diverticulosis of large intestine without perforation or abscess without bleeding: Secondary | ICD-10-CM | POA: Insufficient documentation

## 2022-05-07 DIAGNOSIS — Z85828 Personal history of other malignant neoplasm of skin: Secondary | ICD-10-CM | POA: Diagnosis not present

## 2022-05-07 DIAGNOSIS — F419 Anxiety disorder, unspecified: Secondary | ICD-10-CM | POA: Diagnosis not present

## 2022-05-07 DIAGNOSIS — K640 First degree hemorrhoids: Secondary | ICD-10-CM | POA: Insufficient documentation

## 2022-05-07 DIAGNOSIS — F17200 Nicotine dependence, unspecified, uncomplicated: Secondary | ICD-10-CM | POA: Diagnosis not present

## 2022-05-07 DIAGNOSIS — K2100 Gastro-esophageal reflux disease with esophagitis, without bleeding: Secondary | ICD-10-CM | POA: Diagnosis not present

## 2022-05-07 DIAGNOSIS — G35 Multiple sclerosis: Secondary | ICD-10-CM | POA: Diagnosis not present

## 2022-05-07 DIAGNOSIS — Z86010 Personal history of colonic polyps: Secondary | ICD-10-CM | POA: Diagnosis not present

## 2022-05-07 DIAGNOSIS — R1310 Dysphagia, unspecified: Secondary | ICD-10-CM | POA: Diagnosis present

## 2022-05-07 DIAGNOSIS — F32A Depression, unspecified: Secondary | ICD-10-CM | POA: Diagnosis not present

## 2022-05-07 DIAGNOSIS — K259 Gastric ulcer, unspecified as acute or chronic, without hemorrhage or perforation: Secondary | ICD-10-CM | POA: Insufficient documentation

## 2022-05-07 DIAGNOSIS — M1990 Unspecified osteoarthritis, unspecified site: Secondary | ICD-10-CM | POA: Insufficient documentation

## 2022-05-07 HISTORY — PX: COLONOSCOPY: SHX5424

## 2022-05-07 HISTORY — PX: ESOPHAGOGASTRODUODENOSCOPY: SHX5428

## 2022-05-07 SURGERY — COLONOSCOPY
Anesthesia: General

## 2022-05-07 MED ORDER — EPHEDRINE SULFATE (PRESSORS) 50 MG/ML IJ SOLN
INTRAMUSCULAR | Status: DC | PRN
  Administered 2022-05-07 (×2): 5 mg via INTRAVENOUS

## 2022-05-07 MED ORDER — SODIUM CHLORIDE 0.9 % IV SOLN: INTRAVENOUS | Status: DC

## 2022-05-07 MED ORDER — DEXMEDETOMIDINE HCL IN NACL 200 MCG/50ML IV SOLN
INTRAVENOUS | Status: DC | PRN
  Administered 2022-05-07: 8 ug via INTRAVENOUS

## 2022-05-07 MED ORDER — LIDOCAINE HCL (CARDIAC) PF 100 MG/5ML IV SOSY
PREFILLED_SYRINGE | INTRAVENOUS | Status: DC | PRN
  Administered 2022-05-07: 50 mg via INTRAVENOUS

## 2022-05-07 MED ORDER — PROPOFOL 1000 MG/100ML IV EMUL
INTRAVENOUS | Status: AC
  Filled 2022-05-07: qty 100

## 2022-05-07 MED ORDER — PROPOFOL 500 MG/50ML IV EMUL
INTRAVENOUS | Status: DC | PRN
  Administered 2022-05-07: 200 ug/kg/min via INTRAVENOUS
  Administered 2022-05-07: 30 mg via INTRAVENOUS

## 2022-05-07 NOTE — Op Note (Signed)
[facility] Patient Name: Angela Suarez Procedure Date: 05/07/2022 9:54 AM MRN: [id number] Account #: [id number] Date of Birth: 07/26/1950 Admit Type: Outpatient Age: 72 Room: [facility] ENDO ROOM 2 Gender: Female Note Status: Finalized Instrument Name: Upper Endoscope [id number] Procedure:             Upper GI endoscopy Indications:           Dysphagia, Gastro-esophageal reflux disease Providers:             Benay Pike. [name] MD, MD Referring MD:          Ramonita Lab, MD (Referring MD) Medicines:             Propofol per Anesthesia Complications:         No immediate complications. Procedure:             Pre-Anesthesia Assessment:                        - The risks and benefits of the procedure and the                         sedation options and risks were discussed with the                         patient. All questions were answered and informed                         consent was obtained.                        - Patient identification and proposed procedure were                         verified prior to the procedure by the nurse. The                         procedure was verified in the procedure room.                        - ASA Grade Assessment: III - A patient with severe                         systemic disease.                        - After reviewing the risks and benefits, the patient                         was deemed in satisfactory condition to undergo the                         procedure.                        After obtaining informed consent, the endoscope was                         passed under direct vision. Throughout the procedure,  the patient's blood pressure, pulse, and oxygen                         saturations were monitored continuously. The Endoscope                         was introduced through the mouth, and advanced to the                         third part of duodenum. The upper GI endoscopy was                          accomplished without difficulty. The patient tolerated                         the procedure well. Findings:      The esophagus was normal.      There is no endoscopic evidence of stenosis, stricture or ulcerations in       the lower third of the esophagus.      Scattered moderate inflammation characterized by congestion (edema),       erosions and erythema was found in the gastric antrum. Biopsies were       taken with a cold forceps for Helicobacter pylori testing.      One non-bleeding cratered gastric ulcer with no stigmata of bleeding was       found in the gastric antrum. The lesion was 15 mm in largest dimension.      The cardia and gastric fundus were normal on retroflexion.      The examined duodenum was normal. Impression:            - Normal esophagus.                        - Gastritis. Biopsied.                        - Non-bleeding gastric ulcer with no stigmata of                         bleeding.                        - Normal examined duodenum. Recommendation:        - Await pathology results.                        - No aspirin, ibuprofen, naproxen, or other                         non-steroidal anti-inflammatory drugs.                        - Use Protonix (pantoprazole) 40 mg PO daily.                        - Proceed with colonoscopy Procedure Code(s):     --- Professional ---                        [id number], Esophagogastroduodenoscopy, flexible,  transoral; with biopsy, single or multiple Diagnosis Code(s):     --- Professional ---                        K21.9, Gastro-esophageal reflux disease without                         esophagitis                        R13.10, Dysphagia, unspecified                        K25.9, Gastric ulcer, unspecified as acute or chronic,                         without hemorrhage or perforation                        K29.70, Gastritis, unspecified, without bleeding CPT copyright 2022  American Medical Association. All rights reserved. The codes documented in this report are preliminary and upon coder review may  be revised to meet current compliance requirements. Efrain Sella MD, MD 05/07/2022 10:16:39 AM This report has been signed electronically. Number of Addenda: 0 Note Initiated On: 05/07/2022 9:54 AM Estimated Blood Loss:  Estimated blood loss: none.      [facility]

## 2022-05-07 NOTE — Anesthesia Postprocedure Evaluation (Signed)
Anesthesia Post Note  Patient: Angela Suarez  Procedure(s) Performed: COLONOSCOPY ESOPHAGOGASTRODUODENOSCOPY (EGD)  Patient location during evaluation: PACU Anesthesia Type: General Level of consciousness: awake and alert Pain management: pain level controlled Vital Signs Assessment: post-procedure vital signs reviewed and stable Respiratory status: spontaneous breathing, nonlabored ventilation and respiratory function stable Cardiovascular status: blood pressure returned to baseline and stable Postop Assessment: no apparent nausea or vomiting Anesthetic complications: no   There were no known notable events for this encounter.   Last Vitals:  Vitals:   05/07/22 1034 05/07/22 1054  BP: (!) 93/58 100/70  Pulse: 65 66  Resp: 18 17  Temp: (!) 35.6 C   SpO2: 100% 98%    Last Pain:  Vitals:   05/07/22 1034  TempSrc: Temporal  PainSc: Asleep                 [place]

## 2022-05-07 NOTE — Interval H&P Note (Signed)
History and Physical Interval Note:  05/07/2022 9:55 AM  Angela Suarez  has presented today for surgery, with the diagnosis of History of colon polyps (Z86.010) Dysphagia.  The various methods of treatment have been discussed with the patient and family. After consideration of risks, benefits and other options for treatment, the patient has consented to  Procedure(s): COLONOSCOPY (N/A) ESOPHAGOGASTRODUODENOSCOPY (EGD) (N/A) as a surgical intervention.  The patient's history has been reviewed, patient examined, no change in status, stable for surgery.  I have reviewed the patient's chart and labs.  Questions were answered to the patient's satisfaction.     [place], [place]

## 2022-05-07 NOTE — H&P (Signed)
Outpatient short stay form Pre-procedure 05/07/2022 9:53 AM Angela Suarez K. Angela Suarez, M.D.  Primary Physician: Angela Suarez, M.D.  Reason for visit:  Personal history of adenomatous colon polyps. Dysphagia  History of present illness:  Personal history of colon polyps-patient reports she had a history of adenomatous polyps and a colonoscopy many years ago done at an outside facility. On my review only able to find hyperplastic polyps but she reports these of been adenomas in the past and would like to proceed with her every 5-year colonoscopy. She denies any lower GI symptoms. Has note that father had colon polyps but did not recall this today. Did review her bowels need to be moving well prior to bowel prep and she will take laxative week of colonoscopy. 2. Dysphagia/GERD-may be due to MS/dysmotility, she would like to add on endoscopy to exclude stricture, etc. Had modified barium swallow as above. Infrequently has reflux symptoms and uses Tums as needed given PPI allergy.  I reviewed the risks (including bleeding, perforation, infection, anesthesia complications, cardiac/respiratory complications), benefits and alternatives of colonoscopy. Patient consents to proceed. Denies CP/SOB/blood thinners.    No current facility-administered medications for this encounter.  Medications Prior to Admission  Medication Sig Dispense Refill Last Dose   amLODipine (NORVASC) 5 MG tablet Take 5 mg by mouth daily.   05/07/2022 at 0800   aspirin 325 MG tablet Take 325 mg by mouth in the morning.   Past Week   atorvastatin (LIPITOR) 20 MG tablet Take 20 mg by mouth daily.   05/06/2022   azelastine (ASTELIN) 0.1 % nasal spray Place 1 spray into both nostrils daily as needed for rhinitis or allergies. Use in each nostril as directed   Past Week   cholecalciferol (VITAMIN D3) 25 MCG (1000 UNIT) tablet Take 1,000 Units by mouth daily at 12 noon.   05/06/2022   clonazePAM (KLONOPIN) 1 MG tablet Take 0.5-3 mg by mouth  See admin instructions. Take 0.5 mg by mouth in the afternoon and 3 mg at bedtime   05/06/2022   diphenhydrAMINE-zinc acetate (BENADRYL) cream Apply 1 application topically 3 (three) times daily as needed for itching.   05/06/2022 at prn   polyethylene glycol (MIRALAX / GLYCOLAX) 17 g packet Take 17 g by mouth daily.   05/06/2022   pregabalin (LYRICA) 150 MG capsule Take 150 mg by mouth at bedtime.   05/06/2022   sertraline (ZOLOFT) 100 MG tablet Take 100 mg by mouth in the morning.   06/20/4006   trolamine salicylate (BLUE-EMU HEMP) 10 % cream Apply 1 application topically as needed for muscle pain.   Past Week   vitamin B-12 (CYANOCOBALAMIN) 1000 MCG tablet Take 1,000 mcg by mouth daily at 12 noon.   05/06/2022   aspirin-acetaminophen-caffeine (EXCEDRIN MIGRAINE) 250-250-65 MG tablet Take 1-2 tablets by mouth every 6 (six) hours as needed for headache.    at prn   cephALEXin (KEFLEX) 500 MG capsule Take 1 capsule (500 mg total) by mouth 2 (two) times daily. (Patient not taking: Reported on 05/07/2022) 10 capsule 0 Completed Course   doxycycline (VIBRAMYCIN) 100 MG capsule Take 1 capsule (100 mg total) by mouth 2 (two) times daily. (Patient not taking: Reported on 05/07/2022) 14 capsule 0 Completed Course   HYDROcodone-acetaminophen (NORCO) 5-325 MG tablet Take 1-2 tablets by mouth every 6 (six) hours as needed for moderate pain or severe pain. MAXIMUM TOTAL ACETAMINOPHEN DOSE IS 4000 MG PER DAY (Patient not taking: Reported on 05/07/2022) 30 tablet 0 Completed Course  HYDROcodone-acetaminophen (NORCO/VICODIN) 5-325 MG tablet Take 1 tablet by mouth at bedtime.      lidocaine (LIDODERM) 5 % Place 1 patch onto the skin daily. (Patient not taking: Reported on 05/07/2022) 15 patch 0 Not Taking   losartan (COZAAR) 25 MG tablet Take 25 mg by mouth in the morning. (Patient not taking: Reported on 05/07/2022)   Not Taking   metoprolol tartrate (LOPRESSOR) 100 MG tablet Take 1 tablet ('100mg'$ ) TWO hours prior to your  cardiac CT scan. (Patient not taking: Reported on 05/07/2022) 1 tablet 0 Completed Course   ondansetron (ZOFRAN ODT) 4 MG disintegrating tablet Take 1 tablet (4 mg total) by mouth every 8 (eight) hours as needed for nausea or vomiting. (Patient not taking: Reported on 05/07/2022) 20 tablet 1 Not Taking at p   ondansetron (ZOFRAN) 4 MG tablet Take 1 tablet (4 mg total) by mouth every 8 (eight) hours as needed for up to 10 doses for nausea or vomiting. 10 tablet 0      Allergies  Allergen Reactions   Codeine Nausea And Vomiting   Duloxetine     Disturbed dreams   Hydrocodone     Other reaction(s): Other (See Comments) Ankle swelling   Oxycodone Nausea And Vomiting   Prevacid [Lansoprazole] Diarrhea   Prilosec [Omeprazole] Diarrhea   Pyridium [Phenazopyridine Hcl]    Tramadol     Other reaction(s): Other (See Comments) Ankle swelling   Flagyl [Metronidazole] Rash   Lamisil [Terbinafine] Rash     Past Medical History:  Diagnosis Date   Allergic rhinitis    Anxiety    Arthritis    Cancer ([place])    skin   Cervical disc disease    COPD (chronic obstructive pulmonary disease) (HCC)    Depression    GERD (gastroesophageal reflux disease)    Hypertension    IBS (irritable bowel syndrome)    Lumbar disc disease    Lupus anticoagulant positive    Multiple sclerosis ([place])    Stroke ([place])    06/2012   Thoracic disc disease     Review of systems:  Otherwise negative.    Physical Exam  Gen: Alert, oriented. Appears stated age.  HEENT: Angela Suarez/AT. PERRLA. Lungs: CTA, no wheezes. CV: RR nl S1, S2. Abd: soft, benign, no masses. BS+ Ext: No edema. Pulses 2+    Planned procedures: Proceed with EGD and colonoscopy. The patient understands the nature of the planned procedure, indications, risks, alternatives and potential complications including but not limited to bleeding, infection, perforation, damage to internal organs and possible oversedation/side effects from anesthesia. The  patient agrees and gives consent to proceed.  Please refer to procedure notes for findings, recommendations and patient disposition/instructions.     [name] K. Angela Suarez, M.D. Gastroenterology 05/07/2022  9:53 AM

## 2022-05-07 NOTE — Anesthesia Preprocedure Evaluation (Addendum)
Anesthesia Evaluation  Patient identified by MRN, date of birth, ID band Patient awake    Reviewed: Allergy & Precautions, NPO status , Patient's Chart, lab work & pertinent test results  History of Anesthesia Complications Negative for: history of anesthetic complications  Airway Mallampati: III  TM Distance: >3 FB Neck ROM: full    Dental  (+) Chipped   Pulmonary neg shortness of breath, COPD, Current Smoker and Patient abstained from smoking.   Pulmonary exam normal        Cardiovascular Exercise Tolerance: Good hypertension, (-) angina (-) Past MI and (-) DOE Normal cardiovascular exam     Neuro/Psych  PSYCHIATRIC DISORDERS      Multiple sclerosis CVA (lower extremities per patient. No phsical exam findings), Residual Symptoms    GI/Hepatic Neg liver ROS,GERD  ,,  Endo/Other  negative endocrine ROS    Renal/GU      Musculoskeletal  (+) Arthritis ,    Abdominal   Peds  Hematology negative hematology ROS (+)   Anesthesia Other Findings Past Medical History: No date: Allergic rhinitis No date: Anxiety No date: Arthritis No date: Cancer [facility])     Comment:  skin No date: Cervical disc disease No date: COPD (chronic obstructive pulmonary disease) (HCC) No date: Depression No date: GERD (gastroesophageal reflux disease) No date: Hypertension No date: IBS (irritable bowel syndrome) No date: Lumbar disc disease No date: Lupus anticoagulant positive No date: Multiple sclerosis ([place]) No date: Stroke [facility])     Comment:  06/2012 No date: Thoracic disc disease  Past Surgical History: No date: ABDOMINAL HYSTERECTOMY 1990: BREAST EXCISIONAL BIOPSY; Right     Comment:  x 2, neg No date: colon polyps No date: colonoscipy 09/15/2016: COLONOSCOPY WITH PROPOFOL; N/A     Comment:  Procedure: COLONOSCOPY WITH PROPOFOL;  Surgeon: Manya Silvas, MD;  Location: [facility] ENDOSCOPY;  Service:                Endoscopy;  Laterality: N/A; 1990: POLYPECTOMY; Right     Comment:  throat polyp removed by Dr Ladene Artist.  No date: TONSILLECTOMY  BMI    Body Mass Index: 26.52 kg/m      Reproductive/Obstetrics negative OB ROS                             Anesthesia Physical Anesthesia Plan  ASA: 3  Anesthesia Plan: General   Post-op Pain Management:    Induction: Intravenous  PONV Risk Score and Plan: TIVA and Propofol infusion  Airway Management Planned: Natural Airway  Additional Equipment:   Intra-op Plan:   Post-operative Plan:   Informed Consent: I have reviewed the patients History and Physical, chart, labs and discussed the procedure including the risks, benefits and alternatives for the proposed anesthesia with the patient or authorized representative who has indicated his/her understanding and acceptance.     Dental Advisory Given  Plan Discussed with: Anesthesiologist, CRNA and Surgeon  Anesthesia Plan Comments:         Anesthesia Quick Evaluation

## 2022-05-07 NOTE — Op Note (Signed)
[facility] Patient Name: Angela Suarez Procedure Date: 05/07/2022 9:52 AM MRN: [id number] Account #: [id number] Date of Birth: [birth year] Admit Type: Outpatient Age: 72 Room: [facility] ROOM 2 Gender: Female Note Status: [place] Instrument Name: Jasper Riling [id number] Procedure:             Colonoscopy Indications:           Surveillance: Personal history of adenomatous polyps                         on last colonoscopy > 5 years ago Providers:             Lorie Apley K. [name] MD, MD Referring MD:          Ramonita Lab, MD (Referring MD) Medicines:             Propofol per Anesthesia Complications:         No immediate complications. Procedure:             Pre-Anesthesia Assessment:                        - The risks and benefits of the procedure and the                         sedation options and risks were discussed with the                         patient. All questions were answered and informed                         consent was obtained.                        - Patient identification and proposed procedure were                         verified prior to the procedure by the nurse. The                         procedure was verified in the procedure room.                        - ASA Grade Assessment: III - A patient with severe                         systemic disease.                        - After reviewing the risks and benefits, the patient                         was deemed in satisfactory condition to undergo the                         procedure.                        After obtaining informed consent, the colonoscope was  passed under direct vision. Throughout the procedure,                         the patient's blood pressure, pulse, and oxygen                         saturations were monitored continuously. The                         Colonoscope was introduced through the anus and                          advanced to the the cecum, identified by appendiceal                         orifice and ileocecal valve. The colonoscopy was                         performed without difficulty. The patient tolerated                         the procedure well. The quality of the bowel                         preparation was good. The ileocecal valve, appendiceal                         orifice, and rectum were photographed. Findings:      The perianal and digital rectal examinations were normal. Pertinent       negatives include normal sphincter tone and no palpable rectal lesions.      Non-bleeding internal hemorrhoids were found during retroflexion. The       hemorrhoids were Grade I (internal hemorrhoids that do not prolapse).      A few medium-mouthed diverticula were found in the sigmoid colon.      A 10 mm polyp was found in the descending colon. The polyp was sessile.       The polyp was removed with a hot snare. Resection and retrieval were       complete.      The exam was otherwise without abnormality. Impression:            - Non-bleeding internal hemorrhoids.                        - Diverticulosis in the sigmoid colon.                        - One 10 mm polyp in the descending colon, removed                         with a hot snare. Resected and retrieved.                        - The examination was otherwise normal. Recommendation:        - Patient has a contact number available for                         emergencies. The signs and symptoms of potential  delayed complications were discussed with the patient.                         Return to normal activities tomorrow. Written                         discharge instructions were provided to the patient.                        - Await pathology results from EGD, also performed                         today.                        - Resume previous diet.                        - Continue present medications.                         - Repeat colonoscopy is recommended for surveillance.                         The colonoscopy date will be determined after                         pathology results from today's exam become available                         for review.                        - Return to GI office in 2 months.                        - Use Protonix (pantoprazole) 40 mg PO daily.                        - No aspirin, ibuprofen, naproxen, or other                         non-steroidal anti-inflammatory drugs.                        - Telephone GI office to schedule appointment.                        - Follow up with Laurine Blazer, PA-C at [facility]. [phone].                        - The findings and recommendations were discussed with                         the patient. Procedure Code(s):     --- Professional ---                        [phone], Colonoscopy, flexible; with removal  of                         tumor(s), polyp(s), or other lesion(s) by snare                         technique Diagnosis Code(s):     --- Professional ---                        K57.30, Diverticulosis of large intestine without                         perforation or abscess without bleeding                        D12.4, Benign neoplasm of descending colon                        K64.0, First degree hemorrhoids                        Z86.010, Personal history of colonic polyps CPT copyright 2022 American Medical Association. All rights reserved. The codes documented in this report are preliminary and upon coder review may  be revised to meet current compliance requirements. Efrain Sella MD, MD 05/07/2022 10:33:01 AM This report has been signed electronically. Number of Addenda: 0 Note Initiated On: 05/07/2022 9:52 AM Scope Withdrawal Time: 0 hours 6 minutes 39 seconds  Total Procedure Duration: 0 hours 11 minutes 5 seconds  Estimated Blood Loss:  Estimated blood  loss: none.      [facility]

## 2022-05-07 NOTE — Transfer of Care (Signed)
Immediate Anesthesia Transfer of Care Note  Patient: Angela Suarez  Procedure(s) Performed: COLONOSCOPY ESOPHAGOGASTRODUODENOSCOPY (EGD)  Patient Location: PACU  Anesthesia Type:General  Level of Consciousness: drowsy  Airway & Oxygen Therapy: Patient Spontanous Breathing  Post-op Assessment: Report given to RN and Post -op Vital signs reviewed and stable  Post vital signs: Reviewed and stable  Last Vitals:  Vitals Value Taken Time  BP    Temp    Pulse 65 05/07/22 1035  Resp 20 05/07/22 1035  SpO2 98 % 05/07/22 1035  Vitals shown include unvalidated device data.  Last Pain:  Vitals:   05/07/22 1034  TempSrc: Temporal  PainSc: Asleep         Complications: No notable events documented.

## 2022-05-08 ENCOUNTER — Encounter: Payer: Self-pay | Admitting: Internal Medicine

## 2022-05-08 LAB — SURGICAL PATHOLOGY

## 2022-05-19 ENCOUNTER — Ambulatory Visit
Admission: RE | Admit: 2022-05-19 | Discharge: 2022-05-19 | Disposition: A | Discharge status: Home / Self Care | Payer: Medicare HMO | Source: Ambulatory Visit | Attending: Neurology | Admitting: Neurology

## 2022-05-19 DIAGNOSIS — G35 Multiple sclerosis: Secondary | ICD-10-CM | POA: Diagnosis present

## 2022-05-19 MED ORDER — GADOBUTROL 1 MMOL/ML IV SOLN
6.0000 mL | Freq: Once | INTRAVENOUS | Status: AC | PRN
  Administered 2022-05-19: 6 mL via INTRAVENOUS

## 2022-05-22 ENCOUNTER — Other Ambulatory Visit: Payer: Self-pay | Admitting: Neurosurgery

## 2022-05-22 DIAGNOSIS — M4317 Spondylolisthesis, lumbosacral region: Secondary | ICD-10-CM

## 2022-05-30 ENCOUNTER — Ambulatory Visit
Admission: RE | Admit: 2022-05-30 | Discharge: 2022-05-30 | Disposition: A | Discharge status: Home / Self Care | Payer: Medicare HMO | Source: Ambulatory Visit | Attending: Neurosurgery | Admitting: Neurosurgery

## 2022-05-30 DIAGNOSIS — M4317 Spondylolisthesis, lumbosacral region: Secondary | ICD-10-CM | POA: Insufficient documentation

## 2022-06-05 ENCOUNTER — Encounter: Payer: Self-pay | Admitting: Ophthalmology

## 2022-06-10 NOTE — Discharge Instructions (Signed)

## 2022-06-12 ENCOUNTER — Encounter: Payer: Self-pay | Admitting: Ophthalmology

## 2022-06-12 ENCOUNTER — Encounter
Admission: RE | Disposition: A | Discharge status: Home / Self Care | Payer: Self-pay | Source: Home / Self Care | Attending: Ophthalmology

## 2022-06-12 ENCOUNTER — Ambulatory Visit
Admission: RE | Admit: 2022-06-12 | Discharge: 2022-06-12 | Disposition: A | Discharge status: Home / Self Care | Payer: Medicare HMO | Attending: Ophthalmology | Admitting: Ophthalmology

## 2022-06-12 ENCOUNTER — Ambulatory Visit: Payer: Medicare HMO | Admitting: Anesthesiology

## 2022-06-12 ENCOUNTER — Other Ambulatory Visit: Payer: Self-pay

## 2022-06-12 DIAGNOSIS — Z09 Encounter for follow-up examination after completed treatment for conditions other than malignant neoplasm: Secondary | ICD-10-CM | POA: Diagnosis not present

## 2022-06-12 DIAGNOSIS — K219 Gastro-esophageal reflux disease without esophagitis: Secondary | ICD-10-CM | POA: Insufficient documentation

## 2022-06-12 DIAGNOSIS — F32A Depression, unspecified: Secondary | ICD-10-CM | POA: Diagnosis not present

## 2022-06-12 DIAGNOSIS — Z8673 Personal history of transient ischemic attack (TIA), and cerebral infarction without residual deficits: Secondary | ICD-10-CM | POA: Diagnosis not present

## 2022-06-12 DIAGNOSIS — F17210 Nicotine dependence, cigarettes, uncomplicated: Secondary | ICD-10-CM | POA: Diagnosis not present

## 2022-06-12 DIAGNOSIS — I10 Essential (primary) hypertension: Secondary | ICD-10-CM | POA: Insufficient documentation

## 2022-06-12 DIAGNOSIS — K589 Irritable bowel syndrome without diarrhea: Secondary | ICD-10-CM | POA: Insufficient documentation

## 2022-06-12 DIAGNOSIS — G35 Multiple sclerosis: Secondary | ICD-10-CM | POA: Insufficient documentation

## 2022-06-12 DIAGNOSIS — F419 Anxiety disorder, unspecified: Secondary | ICD-10-CM | POA: Diagnosis not present

## 2022-06-12 DIAGNOSIS — J449 Chronic obstructive pulmonary disease, unspecified: Secondary | ICD-10-CM | POA: Diagnosis not present

## 2022-06-12 DIAGNOSIS — H2512 Age-related nuclear cataract, left eye: Secondary | ICD-10-CM | POA: Insufficient documentation

## 2022-06-12 HISTORY — PX: CATARACT EXTRACTION W/PHACO: SHX586

## 2022-06-12 HISTORY — DX: Simple chronic bronchitis: J41.0

## 2022-06-12 HISTORY — DX: Headache, unspecified: R51.9

## 2022-06-12 SURGERY — PHACOEMULSIFICATION, CATARACT, WITH IOL INSERTION
Anesthesia: Monitor Anesthesia Care | Site: Eye | Laterality: Left

## 2022-06-12 MED ORDER — SIGHTPATH DOSE#1 BSS IO SOLN
INTRAOCULAR | Status: DC | PRN
  Administered 2022-06-12: 15 mL via INTRAOCULAR

## 2022-06-12 MED ORDER — LIDOCAINE HCL (PF) 2 % IJ SOLN
INTRAOCULAR | Status: DC | PRN
  Administered 2022-06-12: 4 mL via INTRAOCULAR

## 2022-06-12 MED ORDER — MOXIFLOXACIN HCL 0.5 % OP SOLN
OPHTHALMIC | Status: DC | PRN
  Administered 2022-06-12: .2 mL via OPHTHALMIC

## 2022-06-12 MED ORDER — ARMC OPHTHALMIC DILATING DROPS
1.0000 | OPHTHALMIC | Status: DC | PRN
  Administered 2022-06-12 (×2): 1 via OPHTHALMIC

## 2022-06-12 MED ORDER — BRIMONIDINE TARTRATE-TIMOLOL 0.2-0.5 % OP SOLN
OPHTHALMIC | Status: DC | PRN
  Administered 2022-06-12: 1 [drp] via OPHTHALMIC

## 2022-06-12 MED ORDER — TETRACAINE HCL 0.5 % OP SOLN
1.0000 [drp] | OPHTHALMIC | Status: DC | PRN
  Administered 2022-06-12 (×3): 1 [drp] via OPHTHALMIC

## 2022-06-12 MED ORDER — SIGHTPATH DOSE#1 NA HYALUR & NA CHOND-NA HYALUR IO KIT
PACK | INTRAOCULAR | Status: DC | PRN
  Administered 2022-06-12: 1 via OPHTHALMIC

## 2022-06-12 MED ORDER — LACTATED RINGERS IV SOLN: INTRAVENOUS | Status: DC

## 2022-06-12 MED ORDER — SIGHTPATH DOSE#1 BSS IO SOLN
INTRAOCULAR | Status: DC | PRN
  Administered 2022-06-12: 66 mL via OPHTHALMIC

## 2022-06-12 MED ORDER — MIDAZOLAM HCL 2 MG/2ML IJ SOLN
INTRAMUSCULAR | Status: DC | PRN
  Administered 2022-06-12: 1 mg via INTRAVENOUS

## 2022-06-12 MED ORDER — FENTANYL CITRATE (PF) 100 MCG/2ML IJ SOLN
INTRAMUSCULAR | Status: DC | PRN
  Administered 2022-06-12: 50 ug via INTRAVENOUS

## 2022-06-12 SURGICAL SUPPLY — 19 items
CANNULA ANT/CHMB 27G (MISCELLANEOUS) IMPLANT
CANNULA ANT/CHMB 27GA (MISCELLANEOUS) IMPLANT
CATARACT SUITE SIGHTPATH (MISCELLANEOUS) ×1 IMPLANT
DISSECTOR HYDRO NUCLEUS 50X22 (MISCELLANEOUS) ×1 IMPLANT
DRSG TEGADERM 2-3/8X2-3/4 SM (GAUZE/BANDAGES/DRESSINGS) ×1 IMPLANT
FEE CATARACT SUITE SIGHTPATH (MISCELLANEOUS) ×1 IMPLANT
GLOVE SURG SYN 7.5  E (GLOVE) ×1
GLOVE SURG SYN 7.5 E (GLOVE) ×1 IMPLANT
GLOVE SURG SYN 7.5 PF PI (GLOVE) ×1 IMPLANT
GLOVE SURG SYN 8.5  E (GLOVE) ×1
GLOVE SURG SYN 8.5 E (GLOVE) ×1 IMPLANT
GLOVE SURG SYN 8.5 PF PI (GLOVE) ×1 IMPLANT
LENS IOL CLRN TRC 5 22.5 IMPLANT
LENS IOL TORIC 22.5 ×1 IMPLANT
NDL FILTER BLUNT 18X1 1/2 (NEEDLE) IMPLANT
NEEDLE FILTER BLUNT 18X1 1/2 (NEEDLE) IMPLANT
SYR 3ML LL SCALE MARK (SYRINGE) IMPLANT
SYR 5ML LL (SYRINGE) IMPLANT
WATER STERILE IRR 250ML POUR (IV SOLUTION) ×1 IMPLANT

## 2022-06-12 NOTE — Anesthesia Preprocedure Evaluation (Signed)
Anesthesia Evaluation  Patient identified by MRN, date of birth, ID band Patient awake    Reviewed: Allergy & Precautions, NPO status , Patient's Chart, lab work & pertinent test results  History of Anesthesia Complications Negative for: history of anesthetic complications  Airway Mallampati: III  TM Distance: >3 FB Neck ROM: full    Dental  (+) Chipped   Pulmonary neg shortness of breath, COPD, Current Smoker and Patient abstained from smoking.   Pulmonary exam normal        Cardiovascular Exercise Tolerance: Good hypertension, (-) angina (-) Past MI and (-) DOE Normal cardiovascular exam     Neuro/Psych  PSYCHIATRIC DISORDERS      Multiple sclerosis CVA (lower extremities per patient. No phsical exam findings), Residual Symptoms    GI/Hepatic Neg liver ROS,GERD  ,,  Endo/Other  negative endocrine ROS    Renal/GU      Musculoskeletal  (+) Arthritis ,    Abdominal   Peds  Hematology negative hematology ROS (+)   Anesthesia Other Findings Past Medical History: No date: Allergic rhinitis No date: Anxiety No date: Arthritis No date: Cancer [facility])     Comment:  skin No date: Cervical disc disease No date: COPD (chronic obstructive pulmonary disease) (HCC) No date: Depression No date: GERD (gastroesophageal reflux disease) No date: Hypertension No date: IBS (irritable bowel syndrome) No date: Lumbar disc disease No date: Lupus anticoagulant positive No date: Multiple sclerosis ([place]) No date: Stroke [facility])     Comment:  06/2012 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  No date: TONSILLECTOMY  BMI    Body Mass Index: 26.52 kg/m      Reproductive/Obstetrics negative OB ROS                             Anesthesia Physical Anesthesia Plan  ASA: 3  Anesthesia Plan: MAC   Post-op Pain Management:    Induction: Intravenous  PONV Risk Score and Plan: Midazolam and Treatment may vary due to age or medical condition  Airway Management Planned: Natural Airway and Nasal Cannula  Additional Equipment:   Intra-op Plan:   Post-operative Plan:   Informed Consent: I have reviewed the patients History and Physical, chart, labs and discussed the procedure including the risks, benefits and alternatives for the proposed anesthesia with the patient or authorized representative who has indicated his/her understanding and acceptance.     Dental Advisory Given  Plan Discussed with: Anesthesiologist, CRNA and Surgeon  Anesthesia Plan Comments:         Anesthesia Quick Evaluation

## 2022-06-12 NOTE — Op Note (Signed)
OPERATIVE NOTE  ADELICIA SHUFFORD [id number] 06/12/2022   PREOPERATIVE DIAGNOSIS: Nuclear sclerotic cataract left eye. H25.12   POSTOPERATIVE DIAGNOSIS: Nuclear sclerotic cataract left eye. H25.12   PROCEDURE:  Phacoemusification with Toric posterior chamber intraocular lens placement of the left eye  Ultrasound time: Procedure(s): CATARACT EXTRACTION PHACO AND INTRAOCULAR LENS PLACEMENT (IOC) LEFT CLAREON TORIC 8.14 00:43.0 (Left)  LENS:   Implant Name Type Inv. Item Serial No. Manufacturer Lot No. LRB No. Used Action  Clareon Toric Aspheric Hydrophobic Acrylic IOL [id number] Intraocular Lens  [id number] ALCON  Left 1 Implanted    +22.50 D [id number] Toric intraocular lens with +3.00 diopters of cylindrical power with axis orientation at 165 degrees.  SURGEON:  Courtney Heys. Lazarus Salines, MD   ANESTHESIA:  Topical with tetracaine drops, augmented with 1% preservative-free intracameral lidocaine.   COMPLICATIONS:  None.   DESCRIPTION OF PROCEDURE:  The patient was identified in the holding room and transported to the operating room and placed in the supine position under the operating microscope.  The left eye was identified as the operative eye, which was prepped and draped in the usual sterile ophthalmic fashion.   A 1 millimeter clear-corneal paracentesis was made inferotemporally. Preservative-free 1% lidocaine mixed with 1:1,000 bisulfite-free aqueous solution of epinephrine was injected into the anterior chamber. The anterior chamber was then filled with Viscoat viscoelastic. A 2.4 millimeter keratome was used to make a clear-corneal incision superotemporally. A curvilinear capsulorrhexis was made with a cystotome and capsulorrhexis forceps. Balanced salt solution was used to hydrodissect and hydrodelineate the nucleus. Phacoemulsification was then used to remove the lens nucleus and epinucleus. The remaining cortex was then removed using the irrigation and aspiration handpiece. Provisc was then placed  into the capsular bag to distend it for lens placement. The Verion digital marker was used to align the implant at the intended axis.  A +22.50 D [id number] Toric lens was then injected into the capsular bag.  It was rotated clockwise until the axis marks on the lens were approximately 15 degrees in the counterclockwise direction to the intended alignment.  The viscoelastic was aspirated from the eye using the irrigation aspiration handpiece.  The I/A handpiece was used to rotate the lens in a clockwise direction until the axis markings of the intraocular lens were lined up with the Verion alignment.  Balanced salt solution was then used to hydrate the wounds.   The anterior chamber was inflated to a physiologic pressure with balanced salt solution.  No wound leaks were noted. Vigamox was injected intracamerally.  Timolol and Brimonidine drops were applied to the eye.  The patient was taken to the recovery room in stable condition without complications of anesthesia or surgery.  Maryann Alar [place] 06/12/2022, 1:40 PM

## 2022-06-12 NOTE — Anesthesia Postprocedure Evaluation (Signed)
Anesthesia Post Note  Patient: Angela Suarez  Procedure(s) Performed: CATARACT EXTRACTION PHACO AND INTRAOCULAR LENS PLACEMENT (IOC) LEFT CLAREON TORIC 8.14 00:43.0 (Left: Eye)  Patient location during evaluation: PACU Anesthesia Type: MAC Level of consciousness: awake and alert Pain management: pain level controlled Vital Signs Assessment: post-procedure vital signs reviewed and stable Respiratory status: spontaneous breathing, nonlabored ventilation, respiratory function stable and patient connected to nasal cannula oxygen Cardiovascular status: stable and blood pressure returned to baseline Postop Assessment: no apparent nausea or vomiting Anesthetic complications: no   No notable events documented.   Last Vitals:  Vitals:   06/12/22 1201 06/12/22 1340  BP: 109/68 122/68  Pulse: 61 62  Resp: 10 14  Temp: (!) 36.2 C 36.4 C  SpO2: 99% 98%    Last Pain:  Vitals:   06/12/22 1340  TempSrc:   PainSc: 0-No pain                 Martha Clan

## 2022-06-12 NOTE — Transfer of Care (Signed)
Immediate Anesthesia Transfer of Care Note  Patient: Angela Suarez  Procedure(s) Performed: CATARACT EXTRACTION PHACO AND INTRAOCULAR LENS PLACEMENT (IOC) LEFT CLAREON TORIC 8.14 00:43.0 (Left: Eye)  Patient Location: PACU  Anesthesia Type: MAC  Level of Consciousness: awake, alert  and patient cooperative  Airway and Oxygen Therapy: Patient Spontanous Breathing and Patient connected to supplemental oxygen  Post-op Assessment: Post-op Vital signs reviewed, Patient's Cardiovascular Status Stable, Respiratory Function Stable, Patent Airway and No signs of Nausea or vomiting  Post-op Vital Signs: Reviewed and stable  Complications: No notable events documented.

## 2022-06-12 NOTE — H&P (Signed)
[facility]   Primary Care Physician:  Adin Hector, MD Ophthalmologist: Dr. Merleen Nicely  Pre-Procedure History & Physical: HPI:  Angela Suarez is a 72 y.o. female here for cataract surgery.   Past Medical History:  Diagnosis Date   Allergic rhinitis    Anxiety    Arthritis    Cancer ([place])    skin   Cervical disc disease    COPD (chronic obstructive pulmonary disease) (HCC)    Depression    GERD (gastroesophageal reflux disease)    Headache    Used to have migraines, now sinus HA   Hypertension    IBS (irritable bowel syndrome)    Lumbar disc disease    Lupus anticoagulant positive    Multiple sclerosis (HCC)    left leg weakness   Smokers' cough ([place])    Stroke ([place])    06/2012, Mild left weakness, lip, cheek, fingers   Thoracic disc disease     Past Surgical History:  Procedure Laterality Date   ABDOMINAL HYSTERECTOMY     BACK SURGERY  1997   Cervical spine   BREAST EXCISIONAL BIOPSY Right 1990   x 2, neg   BREAST SURGERY     colon polyps     colonoscipy     COLONOSCOPY N/A 05/07/2022   Procedure: COLONOSCOPY;  Surgeon: Toledo, Benay Pike, MD;  Location: [facility] ENDOSCOPY;  Service: Endoscopy;  Laterality: N/A;   COLONOSCOPY WITH PROPOFOL N/A 09/15/2016   Procedure: COLONOSCOPY WITH PROPOFOL;  Surgeon: Manya Silvas, MD;  Location: [facility] ENDOSCOPY;  Service: Endoscopy;  Laterality: N/A;   ESOPHAGOGASTRODUODENOSCOPY N/A 05/07/2022   Procedure: ESOPHAGOGASTRODUODENOSCOPY (EGD);  Surgeon: Toledo, Benay Pike, MD;  Location: [facility] ENDOSCOPY;  Service: Endoscopy;  Laterality: N/A;   FRACTURE SURGERY     ORIF HUMERUS FRACTURE Left 12/06/2020   Procedure: OPEN REDUCTION INTERNAL FIXATION (ORIF) PROXIMAL HUMERUS GREATER TUBEROSITY FRACTURE.;  Surgeon: Corky Mull, MD;  Location: [facility] ORS;  Service: Orthopedics;  Laterality: Left;   POLYPECTOMY Right 1990   throat polyp removed by Dr Ladene Artist.    TONSILLECTOMY      Prior to Admission medications    Medication Sig Start Date End Date Taking? Authorizing Provider  acetaminophen (TYLENOL) 650 MG CR tablet Take 650 mg by mouth every 8 (eight) hours as needed for pain.   Yes [provider]  amLODipine (NORVASC) 5 MG tablet Take 5 mg by mouth daily. 11/05/21  Yes [provider]  aspirin-acetaminophen-caffeine (EXCEDRIN MIGRAINE) [phone] MG tablet Take 1-2 tablets by mouth every 6 (six) hours as needed for headache. Uses rarely   Yes [provider]  atorvastatin (LIPITOR) 20 MG tablet Take 20 mg by mouth daily. 12/31/21  Yes [provider]  chlorpheniramine (CHLOR-TRIMETON) 4 MG tablet Take 4 mg by mouth 2 (two) times daily as needed for allergies.   Yes [provider]  cholecalciferol (VITAMIN D3) 25 MCG (1000 UNIT) tablet Take 1,000 Units by mouth daily at 12 noon.   Yes [provider]  clonazePAM (KLONOPIN) 1 MG tablet Take 0.5-3 mg by mouth See admin instructions. Take 0.5 mg by mouth in the afternoon and 3 mg at bedtime   Yes [provider]  diphenhydrAMINE-zinc acetate (BENADRYL) cream Apply 1 application topically 3 (three) times daily as needed for itching.   Yes [provider]  lidocaine (XYLOCAINE) 5 % ointment Apply 1 Application topically as needed. For ear pain   Yes [provider]  pantoprazole (PROTONIX) 40  MG tablet Take 40 mg by mouth daily.   Yes [provider]  pregabalin (LYRICA) 150 MG capsule Take 150 mg by mouth at bedtime.   Yes [provider]  sertraline (ZOLOFT) 100 MG tablet Take 100 mg by mouth in the morning.   Yes [provider]  triamcinolone (NASACORT) 55 MCG/ACT AERO nasal inhaler Place 2 sprays into the nose daily as needed.   Yes [provider]  trolamine salicylate (BLUE-EMU HEMP) 10 % cream Apply 1 application topically as needed for muscle pain.   Yes [provider]  vitamin B-12 (CYANOCOBALAMIN) 1000 MCG tablet Take 1,000  mcg by mouth daily at 12 noon.   Yes [provider]  HYDROcodone-acetaminophen (NORCO/VICODIN) 5-325 MG tablet Take 1 tablet by mouth at bedtime. Patient not taking: Reported on 06/05/2022 11/28/20   [provider]  polyethylene glycol (MIRALAX / GLYCOLAX) 17 g packet Take 17 g by mouth daily.    [provider]    Allergies as of 06/05/2022 - Review Complete 06/05/2022  Allergen Reaction Noted   Codeine Nausea And Vomiting 12/03/2015   Duloxetine  09/12/2016   Hydrocodone  01/15/2021   Oxycodone Nausea And Vomiting 09/12/2016   Prevacid [lansoprazole] Diarrhea 12/03/2015   Prilosec [omeprazole] Diarrhea 09/12/2016   Pyridium [phenazopyridine hcl]  09/12/2016   Tramadol  01/15/2021   Flagyl [metronidazole] Rash 12/03/2015   Lamisil [terbinafine] Rash 09/12/2016    Family History  Problem Relation Age of Onset   Breast cancer Mother 59   Cancer Mother    Breast cancer Paternal Aunt 22   Breast cancer Paternal Aunt 10   Cancer Father     Social History   Socioeconomic History   Marital status: Married    Spouse name: Not on file   Number of children: Not on file   Years of education: Not on file   Highest education level: Not on file  Occupational History   Not on file  Tobacco Use   Smoking status: Every Day    Packs/day: 0.75    Years: 53.00    Total pack years: 39.75    Types: Cigarettes    Passive exposure: Current   Smokeless tobacco: Never   Tobacco comments:    Started smoking age 71.  Tried vaping, didn't like it.  Vaping Use   Vaping Use: Never used  Substance and Sexual Activity   Alcohol use: No   Drug use: No   Sexual activity: Not on file  Other Topics Concern   Not on file  Social History Narrative   Lives with husband [name].   Social Determinants of Health   Financial Resource Strain: Not on file  Food Insecurity: Not on file  Transportation Needs: Not on file  Physical Activity: Not on file  Stress: Not on file   Social Connections: Not on file  Intimate Partner Violence: Not on file    Review of Systems: See HPI, otherwise negative ROS  Physical Exam: Ht '5\' 2"'$  (1.575 m)   Wt 61.2 kg   BMI 24.69 kg/m  General:   Alert, cooperative in NAD Head:  Normocephalic and atraumatic. Respiratory:  Normal work of breathing. Cardiovascular:  RRR  Impression/Plan: Angela Suarez is here for cataract surgery.  Risks, benefits, limitations, and alternatives regarding cataract surgery have been reviewed with the patient.  Questions have been answered.  All parties agreeable.   Norvel Richards, MD  06/12/2022, 11:40 AM

## 2022-06-13 ENCOUNTER — Encounter: Payer: Self-pay | Admitting: Ophthalmology

## 2022-06-17 ENCOUNTER — Encounter: Payer: Self-pay | Admitting: Ophthalmology

## 2022-06-24 NOTE — Discharge Instructions (Signed)

## 2022-06-25 MED ORDER — EPINEPHRINE PF 1 MG/ML IJ SOLN
INTRAMUSCULAR | Status: AC
  Filled 2022-06-25: qty 1

## 2022-06-25 MED ORDER — BUPIVACAINE HCL (PF) 0.25 % IJ SOLN
INTRAMUSCULAR | Status: AC
  Filled 2022-06-25: qty 30

## 2022-06-26 ENCOUNTER — Ambulatory Visit: Payer: Medicare HMO | Admitting: General Practice

## 2022-06-26 ENCOUNTER — Other Ambulatory Visit: Payer: Self-pay

## 2022-06-26 ENCOUNTER — Ambulatory Visit
Admission: RE | Admit: 2022-06-26 | Discharge: 2022-06-26 | Disposition: A | Discharge status: Home / Self Care | Payer: Medicare HMO | Attending: Ophthalmology | Admitting: Ophthalmology

## 2022-06-26 ENCOUNTER — Encounter
Admission: RE | Disposition: A | Discharge status: Home / Self Care | Payer: Self-pay | Source: Home / Self Care | Attending: Ophthalmology

## 2022-06-26 DIAGNOSIS — H2511 Age-related nuclear cataract, right eye: Secondary | ICD-10-CM | POA: Diagnosis not present

## 2022-06-26 DIAGNOSIS — K219 Gastro-esophageal reflux disease without esophagitis: Secondary | ICD-10-CM | POA: Insufficient documentation

## 2022-06-26 DIAGNOSIS — Z8673 Personal history of transient ischemic attack (TIA), and cerebral infarction without residual deficits: Secondary | ICD-10-CM | POA: Diagnosis not present

## 2022-06-26 DIAGNOSIS — G35 Multiple sclerosis: Secondary | ICD-10-CM | POA: Diagnosis not present

## 2022-06-26 DIAGNOSIS — Z09 Encounter for follow-up examination after completed treatment for conditions other than malignant neoplasm: Secondary | ICD-10-CM | POA: Diagnosis not present

## 2022-06-26 DIAGNOSIS — I10 Essential (primary) hypertension: Secondary | ICD-10-CM | POA: Insufficient documentation

## 2022-06-26 DIAGNOSIS — F17210 Nicotine dependence, cigarettes, uncomplicated: Secondary | ICD-10-CM | POA: Insufficient documentation

## 2022-06-26 DIAGNOSIS — F418 Other specified anxiety disorders: Secondary | ICD-10-CM | POA: Diagnosis not present

## 2022-06-26 DIAGNOSIS — J449 Chronic obstructive pulmonary disease, unspecified: Secondary | ICD-10-CM | POA: Diagnosis not present

## 2022-06-26 HISTORY — PX: CATARACT EXTRACTION W/PHACO: SHX586

## 2022-06-26 SURGERY — PHACOEMULSIFICATION, CATARACT, WITH IOL INSERTION
Anesthesia: Monitor Anesthesia Care | Site: Eye | Laterality: Right

## 2022-06-26 MED ORDER — FENTANYL CITRATE (PF) 100 MCG/2ML IJ SOLN
INTRAMUSCULAR | Status: DC | PRN
  Administered 2022-06-26: 50 ug via INTRAVENOUS
  Administered 2022-06-26: 25 ug via INTRAVENOUS

## 2022-06-26 MED ORDER — ARMC OPHTHALMIC DILATING DROPS
1.0000 | OPHTHALMIC | Status: DC | PRN
  Administered 2022-06-26 (×3): 1 via OPHTHALMIC

## 2022-06-26 MED ORDER — BRIMONIDINE TARTRATE-TIMOLOL 0.2-0.5 % OP SOLN
OPHTHALMIC | Status: DC | PRN
  Administered 2022-06-26: 1 [drp] via OPHTHALMIC

## 2022-06-26 MED ORDER — SIGHTPATH DOSE#1 NA HYALUR & NA CHOND-NA HYALUR IO KIT
PACK | INTRAOCULAR | Status: DC | PRN
  Administered 2022-06-26: 1 via OPHTHALMIC

## 2022-06-26 MED ORDER — SIGHTPATH DOSE#1 BSS IO SOLN
INTRAOCULAR | Status: DC | PRN
  Administered 2022-06-26: 76 mL via OPHTHALMIC

## 2022-06-26 MED ORDER — SIGHTPATH DOSE#1 BSS IO SOLN
INTRAOCULAR | Status: DC | PRN
  Administered 2022-06-26: 15 mL via INTRAOCULAR

## 2022-06-26 MED ORDER — TETRACAINE HCL 0.5 % OP SOLN
1.0000 [drp] | OPHTHALMIC | Status: DC | PRN
  Administered 2022-06-26 (×4): 1 [drp] via OPHTHALMIC

## 2022-06-26 MED ORDER — MOXIFLOXACIN HCL 0.5 % OP SOLN
OPHTHALMIC | Status: DC | PRN
  Administered 2022-06-26: .2 mL via OPHTHALMIC

## 2022-06-26 MED ORDER — MIDAZOLAM HCL 2 MG/2ML IJ SOLN
INTRAMUSCULAR | Status: DC | PRN
  Administered 2022-06-26: 2 mg via INTRAVENOUS

## 2022-06-26 MED ORDER — LIDOCAINE HCL (PF) 2 % IJ SOLN
INTRAOCULAR | Status: DC | PRN
  Administered 2022-06-26: 4 mL via INTRAOCULAR

## 2022-06-26 MED ORDER — LACTATED RINGERS IV SOLN: INTRAVENOUS | Status: DC

## 2022-06-26 SURGICAL SUPPLY — 25 items
BLADE CATARACT (BLADE) IMPLANT
CANNULA ANT/CHMB 27G (MISCELLANEOUS) IMPLANT
CANNULA ANT/CHMB 27GA (MISCELLANEOUS) IMPLANT
CATARACT SUITE SIGHTPATH (MISCELLANEOUS) ×1 IMPLANT
DISSECTOR HYDRO NUCLEUS 50X22 (MISCELLANEOUS) ×1 IMPLANT
DRSG TEGADERM 2-3/8X2-3/4 SM (GAUZE/BANDAGES/DRESSINGS) ×1 IMPLANT
FEE CATARACT SUITE SIGHTPATH (MISCELLANEOUS) ×1 IMPLANT
GLOVE SURG SYN 7.5  E (GLOVE) ×1
GLOVE SURG SYN 7.5 E (GLOVE) ×1 IMPLANT
GLOVE SURG SYN 7.5 PF PI (GLOVE) ×1 IMPLANT
GLOVE SURG SYN 8.5  E (GLOVE) ×1
GLOVE SURG SYN 8.5 E (GLOVE) ×1 IMPLANT
GLOVE SURG SYN 8.5 PF PI (GLOVE) ×1 IMPLANT
LENS CLAREON TORIC CNW0T4 22.5 ×1 IMPLANT
LENS IOL CLRN TRC 4 22.5 IMPLANT
NDL FILTER BLUNT 18X1 1/2 (NEEDLE) IMPLANT
NEEDLE FILTER BLUNT 18X1 1/2 (NEEDLE) IMPLANT
PACK VIT ANT 23G (MISCELLANEOUS) IMPLANT
PHACO TIP KELMAN 45DEG (TIP) IMPLANT
RING MALYGIN 7.0 (MISCELLANEOUS) IMPLANT
SUT ETHILON 10-0 CS-B-6CS-B-6 (SUTURE)
SUTURE EHLN 10-0 CS-B-6CS-B-6 (SUTURE) IMPLANT
SYR 3ML LL SCALE MARK (SYRINGE) IMPLANT
SYR 5ML LL (SYRINGE) IMPLANT
WATER STERILE IRR 250ML POUR (IV SOLUTION) ×1 IMPLANT

## 2022-06-26 NOTE — Anesthesia Postprocedure Evaluation (Signed)
Anesthesia Post Note  Patient: Angela Suarez  Procedure(s) Performed: CATARACT EXTRACTION PHACO AND INTRAOCULAR LENS PLACEMENT (IOC) RIGHT CLAREON TORIC 7.23 00:46.0 (Right: Eye)  Patient location during evaluation: PACU Anesthesia Type: MAC Level of consciousness: awake and alert, oriented and patient cooperative Pain management: pain level controlled Vital Signs Assessment: post-procedure vital signs reviewed and stable Respiratory status: spontaneous breathing, nonlabored ventilation and respiratory function stable Cardiovascular status: blood pressure returned to baseline and stable Postop Assessment: adequate PO intake Anesthetic complications: no   No notable events documented.   Last Vitals:  Vitals:   06/26/22 1521 06/26/22 1525  BP: 122/69   Pulse: 66   Resp: 18   Temp: 36.6 C (!) 36.4 C  SpO2: 94%     Last Pain:  Vitals:   06/26/22 1525  TempSrc:   PainSc: 0-No pain                 Darrin Nipper

## 2022-06-26 NOTE — Op Note (Signed)
OPERATIVE NOTE  ADESOLA DUPREY TH:[id number] 06/26/2022   PREOPERATIVE DIAGNOSIS: Nuclear sclerotic cataract right eye. H25.11   POSTOPERATIVE DIAGNOSIS: Nuclear sclerotic cataract right eye. H25.11   PROCEDURE:  Phacoemusification with Toric posterior chamber intraocular lens placement of the right eye  Ultrasound time: Procedure(s): CATARACT EXTRACTION PHACO AND INTRAOCULAR LENS PLACEMENT (IOC) RIGHT CLAREON TORIC 7.23 00:46.0 (Right)  LENS:   Implant Name Type Inv. Item Serial No. Manufacturer Lot No. LRB No. Used Action  Clareon Toric Aspheric Hydrophobic Acrylic IOL [id number] Intraocular Lens  [id number] ALCON  Right 1 Implanted    +22.50 D [id number] Toric intraocular lens with 2.25 diopters of cylindrical power with axis orientation at 15 degrees.  SURGEON:  Courtney Heys. Lazarus Salines, MD   ANESTHESIA:  Topical with tetracaine drops, augmented with 1% preservative-free intracameral lidocaine.   COMPLICATIONS:  None.   DESCRIPTION OF PROCEDURE:  The patient was identified in the holding room and transported to the operating room and placed in the supine position under the operating microscope.  The right eye was identified as the operative eye, which was prepped and draped in the usual sterile ophthalmic fashion.   A 1 millimeter clear-corneal paracentesis was made superotemporally. Preservative-free 1% lidocaine mixed with 1:1,000 bisulfite-free aqueous solution of epinephrine was injected into the anterior chamber. The anterior chamber was then filled with Viscoat viscoelastic. A 2.4 millimeter keratome was used to make a clear-corneal incision inferotemporally. A curvilinear capsulorrhexis was made with a cystotome and capsulorrhexis forceps. Balanced salt solution was used to hydrodissect and hydrodelineate the nucleus. Phacoemulsification was then used to remove the lens nucleus and epinucleus. The remaining cortex was then removed using the irrigation and aspiration handpiece. Provisc was then  placed into the capsular bag to distend it for lens placement. The Verion digital marker was used to align the implant at the intended axis.  A +22.50 D [id number] Toric lens was then injected into the capsular bag.  It was rotated clockwise until the axis marks on the lens were approximately 15 degrees in the counterclockwise direction to the intended alignment.  The viscoelastic was aspirated from the eye using the irrigation aspiration handpiece.  Then, a blunt chopper through the sideport incision was used to rotate the lens in a clockwise direction until the axis markings of the intraocular lens were lined up with the Verion alignment.  Balanced salt solution was then used to hydrate the wounds.   The anterior chamber was inflated to a physiologic pressure with balanced salt solution.  No wound leaks were noted. Vigamox was injected intracamerally.  Timolol and Brimonidine drops were applied to the eye.  The patient was taken to the recovery room in stable condition without complications of anesthesia or surgery.  Maryann Alar [place] 06/26/2022, 3:19 PM

## 2022-06-26 NOTE — Anesthesia Preprocedure Evaluation (Addendum)
Anesthesia Evaluation  Patient identified by MRN, date of birth, ID band Patient awake    Reviewed: Allergy & Precautions, NPO status , Patient's Chart, lab work & pertinent test results  History of Anesthesia Complications Negative for: history of anesthetic complications  Airway Mallampati: III   Neck ROM: Full    Dental  (+) Missing   Pulmonary COPD, Current Smoker (3/4 ppd) and Patient abstained from smoking.   Pulmonary exam normal breath sounds clear to auscultation       Cardiovascular hypertension, Normal cardiovascular exam Rhythm:Regular Rate:Normal     Neuro/Psych  Headaches PSYCHIATRIC DISORDERS Anxiety Depression     Neuromuscular disease (multiple sclerosis) CVA (2014, residual left-sided weakness)    GI/Hepatic PUD,GERD  ,,  Endo/Other  negative endocrine ROS    Renal/GU negative Renal ROS     Musculoskeletal  (+) Arthritis ,    Abdominal   Peds  Hematology negative hematology ROS (+)   Anesthesia Other Findings   Reproductive/Obstetrics                             Anesthesia Physical Anesthesia Plan  ASA: 3  Anesthesia Plan: MAC   Post-op Pain Management:    Induction: Intravenous  PONV Risk Score and Plan: 1 and Treatment may vary due to age or medical condition, Midazolam and TIVA  Airway Management Planned: Natural Airway and Nasal Cannula  Additional Equipment:   Intra-op Plan:   Post-operative Plan:   Informed Consent: I have reviewed the patients History and Physical, chart, labs and discussed the procedure including the risks, benefits and alternatives for the proposed anesthesia with the patient or authorized representative who has indicated his/her understanding and acceptance.     Dental advisory given  Plan Discussed with: CRNA  Anesthesia Plan Comments: (Anesthetic considerations for multiple sclerosis: closely monitor body temperature to  avoid increase above baseline, avoid succinylcholine, pt may have altered sensitivity to NDMRs.   LMA/GETA backup discussed.  Patient consented for risks of anesthesia including but not limited to:  - adverse reactions to medications - damage to eyes, teeth, lips or other oral mucosa - nerve damage due to positioning  - sore throat or hoarseness - damage to heart, brain, nerves, lungs, other parts of body or loss of life  Informed patient about role of CRNA in peri- and intra-operative care.  Patient voiced understanding.)       Anesthesia Quick Evaluation

## 2022-06-26 NOTE — H&P (Signed)
[facility]   Primary Care Physician:  Adin Hector, MD Ophthalmologist: Dr. Merleen Nicely  Pre-Procedure History & Physical: HPI:  Angela Suarez is a 72 y.o. female here for cataract surgery.   Past Medical History:  Diagnosis Date   Allergic rhinitis    Anxiety    Arthritis    Cancer ([place])    skin   Cervical disc disease    COPD (chronic obstructive pulmonary disease) (HCC)    Depression    GERD (gastroesophageal reflux disease)    Headache    Used to have migraines, now sinus HA   Hypertension    IBS (irritable bowel syndrome)    Lumbar disc disease    Lupus anticoagulant positive    Multiple sclerosis (HCC)    left leg weakness   Smokers' cough ([place])    Stroke ([place])    06/2012, Mild left weakness, lip, cheek, fingers   Thoracic disc disease     Past Surgical History:  Procedure Laterality Date   ABDOMINAL HYSTERECTOMY     BACK SURGERY  1997   Cervical spine   BREAST EXCISIONAL BIOPSY Right 1990   x 2, neg   BREAST SURGERY     CATARACT EXTRACTION W/PHACO Left 06/12/2022   Procedure: CATARACT EXTRACTION PHACO AND INTRAOCULAR LENS PLACEMENT ([place]) LEFT CLAREON TORIC 8.14 00:43.0;  Surgeon: Norvel Richards, MD;  Location: [place];  Service: Ophthalmology;  Laterality: Left;   colon polyps     colonoscipy     COLONOSCOPY N/A 05/07/2022   Procedure: COLONOSCOPY;  Surgeon: Toledo, Benay Pike, MD;  Location: [facility] ENDOSCOPY;  Service: Endoscopy;  Laterality: N/A;   COLONOSCOPY WITH PROPOFOL N/A 09/15/2016   Procedure: COLONOSCOPY WITH PROPOFOL;  Surgeon: Manya Silvas, MD;  Location: [facility] ENDOSCOPY;  Service: Endoscopy;  Laterality: N/A;   ESOPHAGOGASTRODUODENOSCOPY N/A 05/07/2022   Procedure: ESOPHAGOGASTRODUODENOSCOPY (EGD);  Surgeon: Toledo, Benay Pike, MD;  Location: [facility] ENDOSCOPY;  Service: Endoscopy;  Laterality: N/A;   FRACTURE SURGERY     ORIF HUMERUS FRACTURE Left 12/06/2020   Procedure: OPEN REDUCTION INTERNAL FIXATION (ORIF)  PROXIMAL HUMERUS GREATER TUBEROSITY FRACTURE.;  Surgeon: Corky Mull, MD;  Location: [facility] ORS;  Service: Orthopedics;  Laterality: Left;   POLYPECTOMY Right 1990   throat polyp removed by Dr Ladene Artist.    TONSILLECTOMY      Prior to Admission medications   Medication Sig Start Date End Date Taking? Authorizing Provider  acetaminophen (TYLENOL) 650 MG CR tablet Take 650 mg by mouth every 8 (eight) hours as needed for pain.    [provider]  amLODipine (NORVASC) 5 MG tablet Take 5 mg by mouth daily. 11/05/21   [provider]  aspirin-acetaminophen-caffeine (EXCEDRIN MIGRAINE) [id number] MG tablet Take 1-2 tablets by mouth every 6 (six) hours as needed for headache. Uses rarely    [provider]  atorvastatin (LIPITOR) 20 MG tablet Take 20 mg by mouth daily. 12/31/21   [provider]  chlorpheniramine (CHLOR-TRIMETON) 4 MG tablet Take 4 mg by mouth 2 (two) times daily as needed for allergies.    [provider]  cholecalciferol (VITAMIN D3) 25 MCG (1000 UNIT) tablet Take 1,000 Units by mouth daily at 12 noon.    [provider]  clonazePAM (KLONOPIN) 1 MG tablet Take 0.5-3 mg by mouth See admin instructions. Take 0.5 mg by mouth in the afternoon and 3 mg at bedtime    [provider]  diphenhydrAMINE-zinc acetate (BENADRYL) cream Apply 1 application  topically 3 (three) times daily as needed for itching.    [provider]  HYDROcodone-acetaminophen (NORCO/VICODIN) 5-325 MG tablet Take 1 tablet by mouth at bedtime. Patient not taking: Reported on 06/05/2022 11/28/20   [provider]  lidocaine (XYLOCAINE) 5 % ointment Apply 1 Application topically as needed. For ear pain    [provider]  pantoprazole (PROTONIX) 40 MG tablet Take 40 mg by mouth daily.    [provider]  polyethylene glycol (MIRALAX / GLYCOLAX) 17 g packet Take 17 g by mouth daily.    [provider]  pregabalin (LYRICA)  150 MG capsule Take 150 mg by mouth at bedtime.    [provider]  sertraline (ZOLOFT) 100 MG tablet Take 100 mg by mouth in the morning.    [provider]  triamcinolone (NASACORT) 55 MCG/ACT AERO nasal inhaler Place 2 sprays into the nose daily as needed.    [provider]  trolamine salicylate (BLUE-EMU HEMP) 10 % cream Apply 1 application topically as needed for muscle pain.    [provider]  vitamin B-12 (CYANOCOBALAMIN) 1000 MCG tablet Take 1,000 mcg by mouth daily at 12 noon.    [provider]    Allergies as of 06/05/2022 - Review Complete 06/05/2022  Allergen Reaction Noted   Codeine Nausea And Vomiting 12/03/2015   Duloxetine  09/12/2016   Hydrocodone  01/15/2021   Oxycodone Nausea And Vomiting 09/12/2016   Prevacid [lansoprazole] Diarrhea 12/03/2015   Prilosec [omeprazole] Diarrhea 09/12/2016   Pyridium [phenazopyridine hcl]  09/12/2016   Tramadol  01/15/2021   Flagyl [metronidazole] Rash 12/03/2015   Lamisil [terbinafine] Rash 09/12/2016    Family History  Problem Relation Age of Onset   Breast cancer Mother 53   Cancer Mother    Breast cancer Paternal Aunt 15   Breast cancer Paternal Aunt 42   Cancer Father     Social History   Socioeconomic History   Marital status: Married    Spouse name: Not on file   Number of children: Not on file   Years of education: Not on file   Highest education level: Not on file  Occupational History   Not on file  Tobacco Use   Smoking status: Every Day    Packs/day: 0.75    Years: 53.00    Additional pack years: 0.00    Total pack years: 39.75    Types: Cigarettes    Passive exposure: Current   Smokeless tobacco: Never   Tobacco comments:    Started smoking age 28.  Tried vaping, didn't like it.  Vaping Use   Vaping Use: Never used  Substance and Sexual Activity   Alcohol use: No   Drug use: No   Sexual activity: Not on file  Other Topics Concern   Not on file   Social History Narrative   Lives with husband [name].   Social Determinants of Health   Financial Resource Strain: Not on file  Food Insecurity: Not on file  Transportation Needs: Not on file  Physical Activity: Not on file  Stress: Not on file  Social Connections: Not on file  Intimate Partner Violence: Not on file    Review of Systems: See HPI, otherwise negative ROS  Physical Exam: Ht 5\' 2"  (1.575 m)   Wt 60.8 kg   BMI 24.51 kg/m  General:   Alert, cooperative in NAD Head:  Normocephalic and atraumatic. Respiratory:  Normal work of breathing. Cardiovascular:  RRR  Impression/Plan: Angela H  Suarez is here for cataract surgery.  Risks, benefits, limitations, and alternatives regarding cataract surgery have been reviewed with the patient.  Questions have been answered.  All parties agreeable.   Norvel Richards, MD  06/26/2022, 11:35 AM

## 2022-06-26 NOTE — Transfer of Care (Signed)
Immediate Anesthesia Transfer of Care Note  Patient: Angela Suarez  Procedure(s) Performed: CATARACT EXTRACTION PHACO AND INTRAOCULAR LENS PLACEMENT (IOC) RIGHT CLAREON TORIC 7.23 00:46.0 (Right: Eye)  Patient Location: PACU  Anesthesia Type: MAC  Level of Consciousness: awake, alert  and patient cooperative  Airway and Oxygen Therapy: Patient Spontanous Breathing and Patient connected to supplemental oxygen  Post-op Assessment: Post-op Vital signs reviewed, Patient's Cardiovascular Status Stable, Respiratory Function Stable, Patent Airway and No signs of Nausea or vomiting  Post-op Vital Signs: Reviewed and stable  Complications: No notable events documented.

## 2022-06-30 ENCOUNTER — Encounter: Payer: Self-pay | Admitting: Ophthalmology

## 2022-08-12 ENCOUNTER — Ambulatory Visit: Payer: Medicare HMO | Admitting: Podiatry

## 2022-08-12 DIAGNOSIS — L84 Corns and callosities: Secondary | ICD-10-CM | POA: Diagnosis not present

## 2022-08-12 NOTE — Progress Notes (Signed)
Subjective:  Patient ID: Angela Suarez, female    DOB: 01/06/1951,  MRN: [id number]  Chief Complaint  Patient presents with   Toe Pain    Left foot 2nd toe     72 y.o. female presents with the above complaint.  Patient presents with left second digit heloma molle.  Patient states been present for quite some time is progressive gotten worse.  She is to wear a lot of high heeled shoes.  She states that hurts with ambulation.  She does not have any shoes that feel any comfortable.  She would like to discuss treatment options for it.  She does not want to undergo surgery   Review of Systems: Negative except as noted in the HPI. Denies N/V/F/Ch.  Past Medical History:  Diagnosis Date   Allergic rhinitis    Anxiety    Arthritis    Cancer (HCC)    skin   Cervical disc disease    COPD (chronic obstructive pulmonary disease) (HCC)    Depression    GERD (gastroesophageal reflux disease)    Headache    Used to have migraines, now sinus HA   Hypertension    IBS (irritable bowel syndrome)    Lumbar disc disease    Lupus anticoagulant positive    Multiple sclerosis (HCC)    left leg weakness   Smokers' cough (HCC)    Stroke (HCC)    06/2012, Mild left weakness, lip, cheek, fingers   Thoracic disc disease     Current Outpatient Medications:    acetaminophen (TYLENOL) 650 MG CR tablet, Take 650 mg by mouth every 8 (eight) hours as needed for pain., Disp: , Rfl:    amLODipine (NORVASC) 5 MG tablet, Take 5 mg by mouth daily., Disp: , Rfl:    aspirin-acetaminophen-caffeine (EXCEDRIN MIGRAINE) 250-250-65 MG tablet, Take 1-2 tablets by mouth every 6 (six) hours as needed for headache. Uses rarely, Disp: , Rfl:    atorvastatin (LIPITOR) 20 MG tablet, Take 20 mg by mouth daily., Disp: , Rfl:    chlorpheniramine (CHLOR-TRIMETON) 4 MG tablet, Take 4 mg by mouth 2 (two) times daily as needed for allergies., Disp: , Rfl:    cholecalciferol (VITAMIN D3) 25 MCG (1000 UNIT) tablet, Take 1,000 Units  by mouth daily at 12 noon., Disp: , Rfl:    clonazePAM (KLONOPIN) 1 MG tablet, Take 0.5-3 mg by mouth See admin instructions. Take 0.5 mg by mouth in the afternoon and 3 mg at bedtime, Disp: , Rfl:    diphenhydrAMINE-zinc acetate (BENADRYL) cream, Apply 1 application topically 3 (three) times daily as needed for itching., Disp: , Rfl:    HYDROcodone-acetaminophen (NORCO/VICODIN) 5-325 MG tablet, Take 1 tablet by mouth at bedtime., Disp: , Rfl:    lidocaine (XYLOCAINE) 5 % ointment, Apply 1 Application topically as needed. For ear pain, Disp: , Rfl:    pantoprazole (PROTONIX) 40 MG tablet, Take 40 mg by mouth daily., Disp: , Rfl:    polyethylene glycol (MIRALAX / GLYCOLAX) 17 g packet, Take 17 g by mouth daily., Disp: , Rfl:    pregabalin (LYRICA) 150 MG capsule, Take 150 mg by mouth at bedtime., Disp: , Rfl:    sertraline (ZOLOFT) 100 MG tablet, Take 100 mg by mouth in the morning., Disp: , Rfl:    triamcinolone (NASACORT) 55 MCG/ACT AERO nasal inhaler, Place 2 sprays into the nose daily as needed., Disp: , Rfl:    trolamine salicylate (BLUE-EMU HEMP) 10 % cream, Apply 1 application topically as needed  for muscle pain., Disp: , Rfl:    vitamin B-12 (CYANOCOBALAMIN) 1000 MCG tablet, Take 1,000 mcg by mouth daily at 12 noon., Disp: , Rfl:   Social History   Tobacco Use  Smoking Status Every Day   Packs/day: 0.75   Years: 53.00   Additional pack years: 0.00   Total pack years: 39.75   Types: Cigarettes   Passive exposure: Current  Smokeless Tobacco Never  Tobacco Comments   Started smoking age 70.  Tried vaping, didn't like it.    Allergies  Allergen Reactions   Codeine Nausea And Vomiting   Duloxetine     Disturbed dreams   Hydrocodone     Other reaction(s): Other (See Comments) Ankle swelling   Oxycodone Nausea And Vomiting   Prevacid [Lansoprazole] Diarrhea   Prilosec [Omeprazole] Diarrhea   Pyridium [Phenazopyridine Hcl]    Tramadol     Other reaction(s): Other (See  Comments) Ankle swelling   Flagyl [Metronidazole] Rash   Lamisil [Terbinafine] Rash   Objective:  There were no vitals filed for this visit. There is no height or weight on file to calculate BMI. Constitutional Well developed. Well nourished.  Vascular Dorsalis pedis pulses palpable bilaterally. Posterior tibial pulses palpable bilaterally. Capillary refill normal to all digits.  No cyanosis or clubbing noted. Pedal hair growth normal.  Neurologic Normal speech. Oriented to person, place, and time. Epicritic sensation to light touch grossly present bilaterally.  Dermatologic Left second digit heloma molle to the medial surface.  Slight hammertoe contracture noted.  Rigid deformity.  Pain on palpation.  Hyperkeratotic lesion noted.  Orthopedic: Normal joint ROM without pain or crepitus bilaterally. No visible deformities. No bony tenderness.   Radiographs: None Assessment:   1. Heloma molle    Plan:  Patient was evaluated and treated and all questions answered.  Left second digit heloma molle -All questions and concerns were discussed with the patient in extensive detail given the amount of pain that she is having she will benefit from shoe gear modification I discussed sandals with a strap in between the toes to keep them separated.  I discussed wide toebox shoes.  She states understanding will work on.  No follow-ups on file.

## 2022-08-27 ENCOUNTER — Other Ambulatory Visit: Payer: Self-pay | Admitting: Surgery

## 2022-09-05 ENCOUNTER — Inpatient Hospital Stay: Admission: RE | Admit: 2022-09-05 | Payer: Medicare HMO | Source: Ambulatory Visit

## 2022-09-16 ENCOUNTER — Ambulatory Visit: Admit: 2022-09-16 | Payer: Medicare HMO | Admitting: Surgery

## 2022-09-16 SURGERY — ARTHROPLASTY, KNEE, TOTAL
Anesthesia: Choice | Site: Knee | Laterality: Right

## 2022-12-02 ENCOUNTER — Inpatient Hospital Stay: Admission: RE | Admit: 2022-12-02 | Payer: Medicare HMO | Source: Ambulatory Visit

## 2022-12-09 ENCOUNTER — Ambulatory Visit: Admit: 2022-12-09 | Payer: Medicare HMO | Admitting: Surgery

## 2022-12-09 SURGERY — TOTAL KNEE ARTHROPLASTY
Anesthesia: Choice | Site: Knee | Laterality: Right

## 2023-03-10 ENCOUNTER — Other Ambulatory Visit: Payer: Self-pay | Admitting: Acute Care

## 2023-03-10 DIAGNOSIS — Z122 Encounter for screening for malignant neoplasm of respiratory organs: Secondary | ICD-10-CM

## 2023-03-10 DIAGNOSIS — Z87891 Personal history of nicotine dependence: Secondary | ICD-10-CM

## 2023-03-10 DIAGNOSIS — F17210 Nicotine dependence, cigarettes, uncomplicated: Secondary | ICD-10-CM

## 2023-04-09 ENCOUNTER — Other Ambulatory Visit: Payer: Self-pay | Admitting: Internal Medicine

## 2023-04-09 DIAGNOSIS — Z1231 Encounter for screening mammogram for malignant neoplasm of breast: Secondary | ICD-10-CM

## 2023-04-20 ENCOUNTER — Ambulatory Visit
Admission: RE | Admit: 2023-04-20 | Discharge: 2023-04-20 | Disposition: A | Discharge status: Home / Self Care | Payer: Medicare HMO | Source: Ambulatory Visit | Attending: Acute Care | Admitting: Acute Care

## 2023-04-20 ENCOUNTER — Ambulatory Visit
Admission: RE | Admit: 2023-04-20 | Discharge: 2023-04-20 | Disposition: A | Discharge status: Home / Self Care | Payer: Medicare HMO | Source: Ambulatory Visit | Attending: Internal Medicine | Admitting: Internal Medicine

## 2023-04-20 DIAGNOSIS — Z87891 Personal history of nicotine dependence: Secondary | ICD-10-CM | POA: Diagnosis present

## 2023-04-20 DIAGNOSIS — Z122 Encounter for screening for malignant neoplasm of respiratory organs: Secondary | ICD-10-CM | POA: Diagnosis present

## 2023-04-20 DIAGNOSIS — F17210 Nicotine dependence, cigarettes, uncomplicated: Secondary | ICD-10-CM | POA: Diagnosis present

## 2023-04-20 DIAGNOSIS — Z1231 Encounter for screening mammogram for malignant neoplasm of breast: Secondary | ICD-10-CM | POA: Insufficient documentation

## 2023-04-27 ENCOUNTER — Other Ambulatory Visit: Payer: Self-pay

## 2023-04-27 DIAGNOSIS — F17210 Nicotine dependence, cigarettes, uncomplicated: Secondary | ICD-10-CM

## 2023-04-27 DIAGNOSIS — Z87891 Personal history of nicotine dependence: Secondary | ICD-10-CM

## 2023-04-27 DIAGNOSIS — Z122 Encounter for screening for malignant neoplasm of respiratory organs: Secondary | ICD-10-CM

## 2023-07-28 ENCOUNTER — Other Ambulatory Visit: Payer: Self-pay | Admitting: Surgery

## 2023-08-03 ENCOUNTER — Encounter
Admission: RE | Admit: 2023-08-03 | Discharge: 2023-08-03 | Disposition: A | Discharge status: Home / Self Care | Source: Ambulatory Visit | Attending: Surgery | Admitting: Surgery

## 2023-08-03 ENCOUNTER — Other Ambulatory Visit: Payer: Self-pay

## 2023-08-03 VITALS — BP 126/71 | HR 66 | Resp 14 | Ht 62.0 in | Wt 135.8 lb

## 2023-08-03 DIAGNOSIS — R9431 Abnormal electrocardiogram [ECG] [EKG]: Secondary | ICD-10-CM | POA: Diagnosis not present

## 2023-08-03 DIAGNOSIS — Z01812 Encounter for preprocedural laboratory examination: Secondary | ICD-10-CM | POA: Diagnosis present

## 2023-08-03 DIAGNOSIS — Z01818 Encounter for other preprocedural examination: Secondary | ICD-10-CM | POA: Diagnosis not present

## 2023-08-03 DIAGNOSIS — Z01810 Encounter for preprocedural cardiovascular examination: Secondary | ICD-10-CM | POA: Diagnosis not present

## 2023-08-03 HISTORY — DX: Spinal stenosis, lumbar region without neurogenic claudication: M48.061

## 2023-08-03 HISTORY — DX: Melanoma in situ, unspecified: D03.9

## 2023-08-03 HISTORY — DX: Deficiency of other specified B group vitamins: E53.8

## 2023-08-03 HISTORY — DX: Lupus anticoagulant syndrome: D68.62

## 2023-08-03 HISTORY — DX: Gross hematuria: R31.0

## 2023-08-03 HISTORY — DX: Personal history of urinary calculi: Z87.442

## 2023-08-03 HISTORY — DX: Vitamin D deficiency, unspecified: E55.9

## 2023-08-03 HISTORY — DX: Atherosclerosis of aorta: I70.0

## 2023-08-03 HISTORY — DX: Other nonthrombocytopenic purpura: D69.2

## 2023-08-03 HISTORY — DX: Atherosclerotic heart disease of native coronary artery without angina pectoris: I25.10

## 2023-08-03 HISTORY — DX: 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: K25.7

## 2023-08-03 HISTORY — DX: Thrombocytopenia, unspecified: D69.6

## 2023-08-03 HISTORY — DX: Unilateral primary osteoarthritis, right knee: M17.11

## 2023-08-03 LAB — URINALYSIS, ROUTINE W REFLEX MICROSCOPIC
Bilirubin Urine: NEGATIVE
Glucose, UA: NEGATIVE mg/dL
Hgb urine dipstick: NEGATIVE
Ketones, ur: NEGATIVE mg/dL
Nitrite: NEGATIVE
Protein, ur: NEGATIVE mg/dL
Specific Gravity, Urine: 1.028 (ref 1.005–1.030)
pH: 5 (ref 5.0–8.0)

## 2023-08-03 LAB — SURGICAL PCR SCREEN
MRSA, PCR: NEGATIVE
Staphylococcus aureus: NEGATIVE

## 2023-08-03 LAB — CBC WITH DIFFERENTIAL/PLATELET
Abs Immature Granulocytes: 0.01 10*3/uL (ref 0.00–0.07)
Basophils Absolute: 0 10*3/uL (ref 0.0–0.1)
Basophils Relative: 1 %
Eosinophils Absolute: 0.1 10*3/uL (ref 0.0–0.5)
Eosinophils Relative: 1 %
HCT: 42.7 % (ref 36.0–46.0)
Hemoglobin: 13.6 g/dL (ref 12.0–15.0)
Immature Granulocytes: 0 %
Lymphocytes Relative: 18 %
Lymphs Abs: 1.2 10*3/uL (ref 0.7–4.0)
MCH: 30.3 pg (ref 26.0–34.0)
MCHC: 31.9 g/dL (ref 30.0–36.0)
MCV: 95.1 fL (ref 80.0–100.0)
Monocytes Absolute: 0.3 10*3/uL (ref 0.1–1.0)
Monocytes Relative: 5 %
Neutro Abs: 4.7 10*3/uL (ref 1.7–7.7)
Neutrophils Relative %: 75 %
Platelets: 140 10*3/uL — ABNORMAL LOW (ref 150–400)
RBC: 4.49 MIL/uL (ref 3.87–5.11)
RDW: 13.7 % (ref 11.5–15.5)
WBC: 6.3 10*3/uL (ref 4.0–10.5)
nRBC: 0 % (ref 0.0–0.2)

## 2023-08-03 LAB — COMPREHENSIVE METABOLIC PANEL WITH GFR
ALT: 16 U/L (ref 0–44)
AST: 21 U/L (ref 15–41)
Albumin: 3.9 g/dL (ref 3.5–5.0)
Alkaline Phosphatase: 66 U/L (ref 38–126)
Anion gap: 7 (ref 5–15)
BUN: 24 mg/dL — ABNORMAL HIGH (ref 8–23)
CO2: 28 mmol/L (ref 22–32)
Calcium: 9.2 mg/dL (ref 8.9–10.3)
Chloride: 107 mmol/L (ref 98–111)
Creatinine, Ser: 0.87 mg/dL (ref 0.44–1.00)
GFR, Estimated: >60 mL/min (ref >60)
Glucose, Bld: 81 mg/dL (ref 70–99)
Potassium: 4 mmol/L (ref 3.5–5.1)
Sodium: 142 mmol/L (ref 135–145)
Total Bilirubin: <0.2 mg/dL (ref 0.0–1.2)
Total Protein: 7.5 g/dL (ref 6.5–8.1)

## 2023-08-03 NOTE — Patient Instructions (Addendum)
 Your procedure is scheduled on:08-11-23 Tuesday Report to the Registration Desk on the 1st floor of the [facility].Then proceed to the 2nd floor Surgery Desk To find out your arrival time, please call [phone] between 1PM - 3PM on:08-10-23 Monday If your arrival time is 6:00 am, do not arrive before that time as the [facility] entrance doors do not open until 6:00 am.  REMEMBER: Instructions that are not followed completely may result in serious medical risk, up to and including death; or upon the discretion of your surgeon and anesthesiologist your surgery may need to be rescheduled.  Do not eat food after midnight the night before surgery.  No gum chewing or hard candies.  You may however, drink CLEAR liquids up to 2 hours before you are scheduled to arrive for your surgery. Do not drink anything within 2 hours of your scheduled arrival time.  Clear liquids include: - water  - apple juice without pulp - gatorade (not RED colors) - black coffee or tea (Do NOT add milk or creamers to the coffee or tea) Do NOT drink anything that is not on this list.  In addition, your doctor has ordered for you to drink the provided:  Ensure Pre-Surgery Clear Carbohydrate Drink  Drinking this carbohydrate drink up to two hours before surgery helps to reduce insulin resistance and improve patient outcomes. Please complete drinking 2 hours before scheduled arrival time.  One week prior to surgery:Stop NOW (08-03-23) Stop Anti-inflammatories (NSAIDS) such as Advil, Aleve, Ibuprofen, Motrin, Naproxen, Naprosyn and Aspirin  based products such as Excedrin, Goody's Powder, BC Powder. Stop ANY OVER THE COUNTER supplements until after surgery (Vitamin B12, Vitamin D3)  You may however, continue to take Tylenol /Tramadol if needed for pain up until the day of surgery.  Continue taking all of your other prescription medications up until the day of surgery.  ON THE DAY OF SURGERY ONLY TAKE THESE MEDICATIONS  WITH SIPS OF WATER: -amLODipine (NORVASC)  -pantoprazole (PROTONIX)  -sertraline (ZOLOFT)   Continue your 81 mg Aspirin  up until the day prior to surgery-Do NOT take the morning of surgery  No Alcohol for 24 hours before or after surgery.  No Smoking including e-cigarettes for 24 hours before surgery.  No chewable tobacco products for at least 6 hours before surgery.  No nicotine patches on the day of surgery.  Do not use any "recreational" drugs for at least a week (preferably 2 weeks) before your surgery.  Please be advised that the combination of cocaine and anesthesia may have negative outcomes, up to and including death. If you test positive for cocaine, your surgery will be cancelled.  On the morning of surgery brush your teeth with toothpaste and water, you may rinse your mouth with mouthwash if you wish. Do not swallow any toothpaste or mouthwash.  Use CHG Soap as directed on instruction sheet.  Do not wear jewelry, make-up, hairpins, clips or nail polish.  For welded (permanent) jewelry: bracelets, anklets, waist bands, etc.  Please have this removed prior to surgery.  If it is not removed, there is a chance that hospital personnel will need to cut it off on the day of surgery.  Do not wear lotions, powders, or perfumes.   Do not shave body hair from the neck down 48 hours before surgery.  Contact lenses, hearing aids and dentures may not be worn into surgery.  Do not bring valuables to the hospital. [facility] is not responsible for any missing/lost belongings or valuables.  Notify your doctor if there is any change in your medical condition (cold, fever, infection).  Wear comfortable clothing (specific to your surgery type) to the hospital.  After surgery, you can help prevent lung complications by doing breathing exercises.  Take deep breaths and cough every 1-2 hours. Your doctor may order a device called an Incentive Spirometer to help you take deep  breaths. When coughing or sneezing, hold a pillow firmly against your incision with both hands. This is called "splinting." Doing this helps protect your incision. It also decreases belly discomfort.  If you are being admitted to the hospital overnight, leave your suitcase in the car. After surgery it may be brought to your room.  In case of increased patient census, it may be necessary for you, the patient, to continue your postoperative care in the Same Day Surgery department.  If you are being discharged the day of surgery, you will not be allowed to drive home. You will need a responsible individual to drive you home and stay with you for 24 hours after surgery.   If you are taking public transportation, you will need to have a responsible individual with you.  Please call the Pre-admissions Testing Dept. at [phone] if you have any questions about these instructions.  Surgery Visitation Policy:  Patients having surgery or a procedure may have two visitors.  Children under the age of 46 must have an adult with them who is not the patient.  Inpatient Visitation:    Visiting hours are 7 a.m. to 8 p.m. Up to four visitors are allowed at one time in a patient room. The visitors may rotate out with other people during the day.  One visitor age 20 or older may stay with the patient overnight and must be in the room by 8 p.m.    Pre-operative 5 CHG Bath Instructions   You can play a key role in reducing the risk of infection after surgery. Your skin needs to be as free of germs as possible. You can reduce the number of germs on your skin by washing with CHG (chlorhexidine  gluconate) soap before surgery. CHG is an antiseptic soap that kills germs and continues to kill germs even after washing.   DO NOT use if you have an allergy to chlorhexidine /CHG or antibacterial soaps. If your skin becomes reddened or irritated, stop using the CHG and notify one of our RNs at [phone].    Please shower with the CHG soap starting 4 days before surgery using the following schedule:     Please keep in mind the following:  DO NOT shave, including legs and underarms, starting the day of your first shower.   You may shave your face at any point before/day of surgery.  Place clean sheets on your bed the day you start using CHG soap. Use a clean washcloth (not used since being washed) for each shower. DO NOT sleep with pets once you start using the CHG.   CHG Shower Instructions:  If you choose to wash your hair and private area, wash first with your normal shampoo/soap.  After you use shampoo/soap, rinse your hair and body thoroughly to remove shampoo/soap residue.  Turn the water OFF and apply about 3 tablespoons (45 ml) of CHG soap to a CLEAN washcloth.  Apply CHG soap ONLY FROM YOUR NECK DOWN TO YOUR TOES (washing for 3-5 minutes)  DO NOT use CHG soap on face, private areas, open wounds, or sores.  Pay special attention to  the area where your surgery is being performed.  If you are having back surgery, having someone wash your back for you may be helpful. Wait 2 minutes after CHG soap is applied, then you may rinse off the CHG soap.  Pat dry with a clean towel  Put on clean clothes/pajamas   If you choose to wear lotion, please use ONLY the CHG-compatible lotions on the back of this paper.     Additional instructions for the day of surgery: DO NOT APPLY any lotions, deodorants, cologne, or perfumes.   Put on clean/comfortable clothes.  Brush your teeth.  Ask your nurse before applying any prescription medications to the skin.      CHG Compatible Lotions   Aveeno Moisturizing lotion  Cetaphil Moisturizing Cream  Cetaphil Moisturizing Lotion  Clairol Herbal Essence Moisturizing Lotion, Dry Skin  Clairol Herbal Essence Moisturizing Lotion, Extra Dry Skin  Clairol Herbal Essence Moisturizing Lotion, Normal Skin  Curel Age Defying Therapeutic Moisturizing Lotion  with Alpha Hydroxy  Curel Extreme Care Body Lotion  Curel Soothing Hands Moisturizing Hand Lotion  Curel Therapeutic Moisturizing Cream, Fragrance-Free  Curel Therapeutic Moisturizing Lotion, Fragrance-Free  Curel Therapeutic Moisturizing Lotion, Original Formula  Eucerin Daily Replenishing Lotion  Eucerin Dry Skin Therapy Plus Alpha Hydroxy Crme  Eucerin Dry Skin Therapy Plus Alpha Hydroxy Lotion  Eucerin Original Crme  Eucerin Original Lotion  Eucerin Plus Crme Eucerin Plus Lotion  Eucerin TriLipid Replenishing Lotion  Keri Anti-Bacterial Hand Lotion  Keri Deep Conditioning Original Lotion Dry Skin Formula Softly Scented  Keri Deep Conditioning Original Lotion, Fragrance Free Sensitive Skin Formula  Keri Lotion Fast Absorbing Fragrance Free Sensitive Skin Formula  Keri Lotion Fast Absorbing Softly Scented Dry Skin Formula  Keri Original Lotion  Keri Skin Renewal Lotion Keri Silky Smooth Lotion  Keri Silky Smooth Sensitive Skin Lotion  Nivea Body Creamy Conditioning Oil  Nivea Body Extra Enriched Lotion  Nivea Body Original Lotion  Nivea Body Sheer Moisturizing Lotion Nivea Crme  Nivea Skin Firming Lotion  NutraDerm 30 Skin Lotion  NutraDerm Skin Lotion  NutraDerm Therapeutic Skin Cream  NutraDerm Therapeutic Skin Lotion  ProShield Protective Hand Cream  Provon moisturizing lotion  How to Use an Incentive Spirometer An incentive spirometer is a tool that measures how well you are filling your lungs with each breath. Learning to take long, deep breaths using this tool can help you keep your lungs clear and active. This may help to reverse or lessen your chance of developing breathing (pulmonary) problems, especially infection. You may be asked to use a spirometer: After a surgery. If you have a lung problem or a history of smoking. After a long period of time when you have been unable to move or be active. If the spirometer includes an indicator to show the highest number  that you have reached, your health care provider or respiratory therapist will help you set a goal. Keep a log of your progress as told by your health care provider. What are the risks? Breathing too quickly may cause dizziness or cause you to pass out. Take your time so you do not get dizzy or light-headed. If you are in pain, you may need to take pain medicine before doing incentive spirometry. It is harder to take a deep breath if you are having pain. How to use your incentive spirometer  Sit up on the edge of your bed or on a chair. Hold the incentive spirometer so that it is in an upright position. Before you  use the spirometer, breathe out normally. Place the mouthpiece in your mouth. Make sure your lips are closed tightly around it. Breathe in slowly and as deeply as you can through your mouth, causing the piston or the ball to rise toward the top of the chamber. Hold your breath for 3-5 seconds, or for as long as possible. If the spirometer includes a coach indicator, use this to guide you in breathing. Slow down your breathing if the indicator goes above the marked areas. Remove the mouthpiece from your mouth and breathe out normally. The piston or ball will return to the bottom of the chamber. Rest for a few seconds, then repeat the steps 10 or more times. Take your time and take a few normal breaths between deep breaths so that you do not get dizzy or light-headed. Do this every 1-2 hours when you are awake. If the spirometer includes a goal marker to show the highest number you have reached (best effort), use this as a goal to work toward during each repetition. After each set of 10 deep breaths, cough a few times. This will help to make sure that your lungs are clear. If you have an incision on your chest or abdomen from surgery, place a pillow or a rolled-up towel firmly against the incision when you cough. This can help to reduce pain while taking deep breaths and coughing. General  tips When you are able to get out of bed: Walk around often. Continue to take deep breaths and cough in order to clear your lungs. Keep using the incentive spirometer until your health care provider says it is okay to stop using it. If you have been in the hospital, you may be told to keep using the spirometer at home. Contact a health care provider if: You are having difficulty using the spirometer. You have trouble using the spirometer as often as instructed. Your pain medicine is not giving enough relief for you to use the spirometer as told. You have a fever. Get help right away if: You develop shortness of breath. You develop a cough with bloody mucus from the lungs. You have fluid or blood coming from an incision site after you cough. Summary An incentive spirometer is a tool that can help you learn to take long, deep breaths to keep your lungs clear and active. You may be asked to use a spirometer after a surgery, if you have a lung problem or a history of smoking, or if you have been inactive for a long period of time. Use your incentive spirometer as instructed every 1-2 hours while you are awake. If you have an incision on your chest or abdomen, place a pillow or a rolled-up towel firmly against your incision when you cough. This will help to reduce pain. Get help right away if you have shortness of breath, you cough up bloody mucus, or blood comes from your incision when you cough. This information is not intended to replace advice given to you by your health care provider. Make sure you discuss any questions you have with your health care provider. Document Revised: 01/30/2023 Document Reviewed: 01/30/2023 Elsevier Patient Education  2024 [employer]    Preoperative Educational Videos for Total Hip, Knee and Shoulder Replacements  To better prepare for surgery, please view our videos that explain the physical activity and discharge planning required to have the best  surgical recovery at [facility].  [email or web address]  Questions? Call [phone] or email [email or web address]

## 2023-08-10 MED ORDER — LACTATED RINGERS IV SOLN: INTRAVENOUS | Status: DC

## 2023-08-10 MED ORDER — TRANEXAMIC ACID-NACL 1000-0.7 MG/100ML-% IV SOLN
1000.0000 mg | INTRAVENOUS | Status: AC
  Administered 2023-08-11: 1000 mg via INTRAVENOUS

## 2023-08-10 MED ORDER — ORAL CARE MOUTH RINSE: 15.0000 mL | Freq: Once | OROMUCOSAL | Status: AC

## 2023-08-10 MED ORDER — CEFAZOLIN SODIUM-DEXTROSE 2-4 GM/100ML-% IV SOLN
2.0000 g | INTRAVENOUS | Status: AC
Start: 2023-08-11 — End: 2023-08-12
  Administered 2023-08-11: 2 g via INTRAVENOUS

## 2023-08-10 MED ORDER — CHLORHEXIDINE GLUCONATE 0.12 % MT SOLN
15.0000 mL | Freq: Once | OROMUCOSAL | Status: AC
  Administered 2023-08-11: 15 mL via OROMUCOSAL

## 2023-08-11 ENCOUNTER — Other Ambulatory Visit: Payer: Self-pay

## 2023-08-11 ENCOUNTER — Ambulatory Visit

## 2023-08-11 ENCOUNTER — Ambulatory Visit: Payer: Self-pay | Admitting: Urgent Care

## 2023-08-11 ENCOUNTER — Encounter
Admission: RE | Disposition: A | Discharge status: Home / Self Care | Payer: Self-pay | Source: Home / Self Care | Attending: Surgery

## 2023-08-11 ENCOUNTER — Ambulatory Visit: Payer: Self-pay

## 2023-08-11 ENCOUNTER — Ambulatory Visit
Admission: RE | Admit: 2023-08-11 | Discharge: 2023-08-12 | Disposition: A | Discharge status: Home / Self Care | Attending: Surgery | Admitting: Surgery

## 2023-08-11 ENCOUNTER — Encounter: Payer: Self-pay | Admitting: Surgery

## 2023-08-11 DIAGNOSIS — K219 Gastro-esophageal reflux disease without esophagitis: Secondary | ICD-10-CM | POA: Diagnosis not present

## 2023-08-11 DIAGNOSIS — I10 Essential (primary) hypertension: Secondary | ICD-10-CM | POA: Insufficient documentation

## 2023-08-11 DIAGNOSIS — I2510 Atherosclerotic heart disease of native coronary artery without angina pectoris: Secondary | ICD-10-CM | POA: Insufficient documentation

## 2023-08-11 DIAGNOSIS — J449 Chronic obstructive pulmonary disease, unspecified: Secondary | ICD-10-CM | POA: Diagnosis not present

## 2023-08-11 DIAGNOSIS — G35 Multiple sclerosis: Secondary | ICD-10-CM | POA: Diagnosis not present

## 2023-08-11 DIAGNOSIS — I700 Atherosclerosis of aorta: Secondary | ICD-10-CM | POA: Diagnosis not present

## 2023-08-11 DIAGNOSIS — Z96651 Presence of right artificial knee joint: Secondary | ICD-10-CM

## 2023-08-11 DIAGNOSIS — F17200 Nicotine dependence, unspecified, uncomplicated: Secondary | ICD-10-CM | POA: Insufficient documentation

## 2023-08-11 DIAGNOSIS — D6862 Lupus anticoagulant syndrome: Secondary | ICD-10-CM | POA: Insufficient documentation

## 2023-08-11 DIAGNOSIS — M1711 Unilateral primary osteoarthritis, right knee: Secondary | ICD-10-CM | POA: Diagnosis present

## 2023-08-11 HISTORY — PX: TOTAL KNEE ARTHROPLASTY: SHX125

## 2023-08-11 SURGERY — ARTHROPLASTY, KNEE, TOTAL
Anesthesia: General | Site: Knee | Laterality: Right

## 2023-08-11 MED ORDER — HYDROMORPHONE HCL 1 MG/ML IJ SOLN
0.5000 mg | INTRAMUSCULAR | Status: DC | PRN
Start: 2023-08-11 — End: 2023-08-12
  Filled 2023-08-11: qty 1

## 2023-08-11 MED ORDER — ROCURONIUM BROMIDE 10 MG/ML (PF) SYRINGE
PREFILLED_SYRINGE | INTRAVENOUS | Status: AC
  Filled 2023-08-11: qty 10

## 2023-08-11 MED ORDER — DOCUSATE SODIUM 100 MG PO CAPS
100.0000 mg | ORAL_CAPSULE | Freq: Two times a day (BID) | ORAL | Status: DC
  Administered 2023-08-11 – 2023-08-12 (×3): 100 mg via ORAL
  Filled 2023-08-11 (×3): qty 1

## 2023-08-11 MED ORDER — KETOROLAC TROMETHAMINE 30 MG/ML IJ SOLN
INTRAMUSCULAR | Status: AC
  Filled 2023-08-11: qty 1

## 2023-08-11 MED ORDER — KETOROLAC TROMETHAMINE 15 MG/ML IJ SOLN
7.5000 mg | Freq: Four times a day (QID) | INTRAMUSCULAR | Status: DC
  Administered 2023-08-11 – 2023-08-12 (×3): 7.5 mg via INTRAVENOUS
  Filled 2023-08-11 (×3): qty 1

## 2023-08-11 MED ORDER — HYDROMORPHONE HCL 2 MG PO TABS
1.0000 mg | ORAL_TABLET | ORAL | Status: DC | PRN
  Administered 2023-08-11: 1 mg via ORAL
  Filled 2023-08-11: qty 1

## 2023-08-11 MED ORDER — DROPERIDOL 2.5 MG/ML IJ SOLN: 0.6250 mg | Freq: Once | INTRAMUSCULAR | Status: DC | PRN

## 2023-08-11 MED ORDER — SODIUM CHLORIDE (PF) 0.9 % IJ SOLN
INTRAMUSCULAR | Status: AC
  Filled 2023-08-11: qty 40

## 2023-08-11 MED ORDER — PHENYLEPHRINE HCL-NACL 20-0.9 MG/250ML-% IV SOLN
INTRAVENOUS | Status: DC | PRN
  Administered 2023-08-11 (×2): 40 ug via INTRAVENOUS

## 2023-08-11 MED ORDER — CEFAZOLIN SODIUM-DEXTROSE 2-4 GM/100ML-% IV SOLN
2.0000 g | Freq: Four times a day (QID) | INTRAVENOUS | Status: AC
  Administered 2023-08-11 (×2): 2 g via INTRAVENOUS
  Filled 2023-08-11 (×2): qty 100

## 2023-08-11 MED ORDER — BUPIVACAINE HCL (PF) 0.5 % IJ SOLN
INTRAMUSCULAR | Status: AC
  Filled 2023-08-11: qty 10

## 2023-08-11 MED ORDER — DEXMEDETOMIDINE HCL IN NACL 80 MCG/20ML IV SOLN
INTRAVENOUS | Status: AC
  Filled 2023-08-11: qty 20

## 2023-08-11 MED ORDER — TRANEXAMIC ACID-NACL 1000-0.7 MG/100ML-% IV SOLN
INTRAVENOUS | Status: AC
  Filled 2023-08-11: qty 100

## 2023-08-11 MED ORDER — KETOROLAC TROMETHAMINE 15 MG/ML IJ SOLN
15.0000 mg | Freq: Once | INTRAMUSCULAR | Status: AC
  Administered 2023-08-11: 15 mg via INTRAVENOUS

## 2023-08-11 MED ORDER — TRAMADOL HCL 50 MG PO TABS: 50.0000 mg | ORAL_TABLET | Freq: Four times a day (QID) | ORAL | Status: DC | PRN

## 2023-08-11 MED ORDER — ONDANSETRON HCL 4 MG/2ML IJ SOLN
4.0000 mg | Freq: Four times a day (QID) | INTRAMUSCULAR | Status: DC | PRN
Start: 2023-08-11 — End: 2023-08-12

## 2023-08-11 MED ORDER — MIDAZOLAM HCL 2 MG/2ML IJ SOLN
INTRAMUSCULAR | Status: AC
  Filled 2023-08-11: qty 2

## 2023-08-11 MED ORDER — PREGABALIN 75 MG PO CAPS
75.0000 mg | ORAL_CAPSULE | Freq: Every day | ORAL | Status: DC
  Administered 2023-08-11 – 2023-08-12 (×2): 75 mg via ORAL
  Filled 2023-08-11 (×2): qty 1

## 2023-08-11 MED ORDER — ACETAMINOPHEN 10 MG/ML IV SOLN: 1000.0000 mg | Freq: Once | INTRAVENOUS | Status: DC | PRN

## 2023-08-11 MED ORDER — PROPOFOL 10 MG/ML IV BOLUS
INTRAVENOUS | Status: AC
  Filled 2023-08-11: qty 20

## 2023-08-11 MED ORDER — ROCURONIUM BROMIDE 100 MG/10ML IV SOLN
INTRAVENOUS | Status: DC | PRN
  Administered 2023-08-11: 60 mg via INTRAVENOUS
  Administered 2023-08-11: 10 mg via INTRAVENOUS

## 2023-08-11 MED ORDER — KETOROLAC TROMETHAMINE 15 MG/ML IJ SOLN
INTRAMUSCULAR | Status: AC
  Filled 2023-08-11: qty 1

## 2023-08-11 MED ORDER — FENTANYL CITRATE (PF) 100 MCG/2ML IJ SOLN: 25.0000 ug | INTRAMUSCULAR | Status: DC | PRN

## 2023-08-11 MED ORDER — PROPOFOL 10 MG/ML IV BOLUS
INTRAVENOUS | Status: DC | PRN
  Administered 2023-08-11: 90 mg via INTRAVENOUS

## 2023-08-11 MED ORDER — TRIAMCINOLONE ACETONIDE 40 MG/ML IJ SUSP
INTRAMUSCULAR | Status: AC
  Filled 2023-08-11: qty 2

## 2023-08-11 MED ORDER — ONDANSETRON HCL 4 MG/2ML IJ SOLN
INTRAMUSCULAR | Status: DC | PRN
  Administered 2023-08-11: 4 mg via INTRAVENOUS

## 2023-08-11 MED ORDER — ACETAMINOPHEN 325 MG PO TABS: 325.0000 mg | ORAL_TABLET | Freq: Four times a day (QID) | ORAL | Status: DC | PRN

## 2023-08-11 MED ORDER — PANTOPRAZOLE SODIUM 20 MG PO TBEC
20.0000 mg | DELAYED_RELEASE_TABLET | ORAL | Status: DC
  Administered 2023-08-12: 20 mg via ORAL
  Filled 2023-08-11: qty 1

## 2023-08-11 MED ORDER — BUPIVACAINE-EPINEPHRINE (PF) 0.5% -1:200000 IJ SOLN
INTRAMUSCULAR | Status: AC
Start: 2023-08-11 — End: ?
  Filled 2023-08-11: qty 30

## 2023-08-11 MED ORDER — METOCLOPRAMIDE HCL 5 MG PO TABS: 5.0000 mg | ORAL_TABLET | Freq: Three times a day (TID) | ORAL | Status: DC | PRN

## 2023-08-11 MED ORDER — DEXAMETHASONE SODIUM PHOSPHATE 10 MG/ML IJ SOLN
INTRAMUSCULAR | Status: AC
  Filled 2023-08-11: qty 1

## 2023-08-11 MED ORDER — LIDOCAINE HCL (CARDIAC) PF 100 MG/5ML IV SOSY
PREFILLED_SYRINGE | INTRAVENOUS | Status: DC | PRN
  Administered 2023-08-11: 40 mg via INTRAVENOUS

## 2023-08-11 MED ORDER — OXYCODONE HCL 5 MG/5ML PO SOLN: 5.0000 mg | Freq: Once | ORAL | Status: DC | PRN

## 2023-08-11 MED ORDER — PREGABALIN 75 MG PO CAPS
150.0000 mg | ORAL_CAPSULE | Freq: Every day | ORAL | Status: DC
  Administered 2023-08-11: 150 mg via ORAL
  Filled 2023-08-11: qty 2

## 2023-08-11 MED ORDER — ACETAMINOPHEN 10 MG/ML IV SOLN
INTRAVENOUS | Status: AC
  Filled 2023-08-11: qty 100

## 2023-08-11 MED ORDER — PHENYLEPHRINE HCL-NACL 20-0.9 MG/250ML-% IV SOLN
INTRAVENOUS | Status: AC
  Filled 2023-08-11: qty 250

## 2023-08-11 MED ORDER — ONDANSETRON HCL 4 MG PO TABS: 4.0000 mg | ORAL_TABLET | Freq: Four times a day (QID) | ORAL | Status: DC | PRN

## 2023-08-11 MED ORDER — DIPHENHYDRAMINE HCL 12.5 MG/5ML PO ELIX: 12.5000 mg | ORAL_SOLUTION | ORAL | Status: DC | PRN

## 2023-08-11 MED ORDER — BISACODYL 10 MG RE SUPP: 10.0000 mg | Freq: Every day | RECTAL | Status: DC | PRN

## 2023-08-11 MED ORDER — LIDOCAINE HCL (PF) 2 % IJ SOLN
INTRAMUSCULAR | Status: AC
  Filled 2023-08-11: qty 5

## 2023-08-11 MED ORDER — PROPOFOL 1000 MG/100ML IV EMUL
INTRAVENOUS | Status: AC
  Filled 2023-08-11: qty 100

## 2023-08-11 MED ORDER — TRIAMCINOLONE ACETONIDE 55 MCG/ACT NA AERO: 2.0000 | INHALATION_SPRAY | Freq: Every day | NASAL | Status: DC | PRN

## 2023-08-11 MED ORDER — STERILE WATER FOR IRRIGATION IR SOLN
Status: DC | PRN
  Administered 2023-08-11: 1000 mL

## 2023-08-11 MED ORDER — VITAMIN B-12 1000 MCG PO TABS
1000.0000 ug | ORAL_TABLET | Freq: Every day | ORAL | Status: DC
  Administered 2023-08-12: 1000 ug via ORAL
  Filled 2023-08-11: qty 1

## 2023-08-11 MED ORDER — ATORVASTATIN CALCIUM 20 MG PO TABS
20.0000 mg | ORAL_TABLET | Freq: Every evening | ORAL | Status: DC
  Administered 2023-08-11: 20 mg via ORAL
  Filled 2023-08-11: qty 1

## 2023-08-11 MED ORDER — SUGAMMADEX SODIUM 200 MG/2ML IV SOLN
INTRAVENOUS | Status: DC | PRN
  Administered 2023-08-11: 140 mg via INTRAVENOUS

## 2023-08-11 MED ORDER — CEFAZOLIN SODIUM-DEXTROSE 2-4 GM/100ML-% IV SOLN
INTRAVENOUS | Status: AC
  Filled 2023-08-11: qty 100

## 2023-08-11 MED ORDER — SODIUM CHLORIDE 0.9 % IV SOLN: INTRAVENOUS | Status: AC

## 2023-08-11 MED ORDER — CLONAZEPAM 1 MG PO TABS
3.0000 mg | ORAL_TABLET | Freq: Every day | ORAL | Status: DC
  Administered 2023-08-11: 3 mg via ORAL
  Filled 2023-08-11: qty 3

## 2023-08-11 MED ORDER — AMLODIPINE BESYLATE 5 MG PO TABS
5.0000 mg | ORAL_TABLET | ORAL | Status: DC
  Administered 2023-08-12: 5 mg via ORAL
  Filled 2023-08-11: qty 1

## 2023-08-11 MED ORDER — BUPIVACAINE LIPOSOME 1.3 % IJ SUSP
INTRAMUSCULAR | Status: AC
  Filled 2023-08-11: qty 20

## 2023-08-11 MED ORDER — MAGNESIUM HYDROXIDE 400 MG/5ML PO SUSP: 30.0000 mL | Freq: Every day | ORAL | Status: DC | PRN

## 2023-08-11 MED ORDER — ACETAMINOPHEN 10 MG/ML IV SOLN
INTRAVENOUS | Status: DC | PRN
  Administered 2023-08-11: 1000 mg via INTRAVENOUS

## 2023-08-11 MED ORDER — ONDANSETRON HCL 4 MG/2ML IJ SOLN
INTRAMUSCULAR | Status: AC
  Filled 2023-08-11: qty 2

## 2023-08-11 MED ORDER — METOCLOPRAMIDE HCL 5 MG/ML IJ SOLN: 5.0000 mg | Freq: Three times a day (TID) | INTRAMUSCULAR | Status: DC | PRN

## 2023-08-11 MED ORDER — VITAMIN D 25 MCG (1000 UNIT) PO TABS
1000.0000 [IU] | ORAL_TABLET | Freq: Every day | ORAL | Status: DC
  Administered 2023-08-12: 1000 [IU] via ORAL
  Filled 2023-08-11: qty 1

## 2023-08-11 MED ORDER — CHLORHEXIDINE GLUCONATE 0.12 % MT SOLN
OROMUCOSAL | Status: AC
  Filled 2023-08-11: qty 15

## 2023-08-11 MED ORDER — ACETAMINOPHEN 500 MG PO TABS
1000.0000 mg | ORAL_TABLET | Freq: Four times a day (QID) | ORAL | Status: AC
  Administered 2023-08-11 (×2): 1000 mg via ORAL
  Filled 2023-08-11 (×3): qty 2

## 2023-08-11 MED ORDER — SODIUM CHLORIDE 0.9 % IR SOLN
Status: DC | PRN
  Administered 2023-08-11: 3000 mL

## 2023-08-11 MED ORDER — FLEET ENEMA RE ENEM: 1.0000 | ENEMA | Freq: Once | RECTAL | Status: DC | PRN

## 2023-08-11 MED ORDER — FENTANYL CITRATE (PF) 100 MCG/2ML IJ SOLN
INTRAMUSCULAR | Status: DC | PRN
Start: 2023-08-11 — End: 2023-08-11
  Administered 2023-08-11: 25 ug via INTRAVENOUS
  Administered 2023-08-11: 50 ug via INTRAVENOUS
  Administered 2023-08-11: 25 ug via INTRAVENOUS

## 2023-08-11 MED ORDER — SERTRALINE HCL 50 MG PO TABS
100.0000 mg | ORAL_TABLET | Freq: Every morning | ORAL | Status: DC
  Administered 2023-08-12: 100 mg via ORAL
  Filled 2023-08-11: qty 2

## 2023-08-11 MED ORDER — DEXAMETHASONE SODIUM PHOSPHATE 10 MG/ML IJ SOLN
INTRAMUSCULAR | Status: DC | PRN
  Administered 2023-08-11: 10 mg via INTRAVENOUS

## 2023-08-11 MED ORDER — DEXMEDETOMIDINE HCL IN NACL 80 MCG/20ML IV SOLN
INTRAVENOUS | Status: DC | PRN
  Administered 2023-08-11: 8 ug via INTRAVENOUS
  Administered 2023-08-11: 4 ug via INTRAVENOUS

## 2023-08-11 MED ORDER — TRIAMCINOLONE ACETONIDE 40 MG/ML IJ SUSP
INTRAMUSCULAR | Status: DC | PRN
  Administered 2023-08-11: 93 mL via INTRAMUSCULAR

## 2023-08-11 MED ORDER — APIXABAN 2.5 MG PO TABS
2.5000 mg | ORAL_TABLET | Freq: Two times a day (BID) | ORAL | Status: DC
  Administered 2023-08-12: 2.5 mg via ORAL
  Filled 2023-08-11: qty 1

## 2023-08-11 MED ORDER — FENTANYL CITRATE (PF) 100 MCG/2ML IJ SOLN
INTRAMUSCULAR | Status: AC
  Filled 2023-08-11: qty 2

## 2023-08-11 MED ORDER — OXYCODONE HCL 5 MG PO TABS: 5.0000 mg | ORAL_TABLET | Freq: Once | ORAL | Status: DC | PRN

## 2023-08-11 SURGICAL SUPPLY — 53 items
BLADE SAW 90X13X1.19 OSCILLAT (BLADE) ×1 IMPLANT
BLADE SAW SAG 25X90X1.19 (BLADE) ×1 IMPLANT
BLADE SURG SZ20 CARB STEEL (BLADE) ×1 IMPLANT
BNDG ELASTIC 6X5.8 VLCR NS LF (GAUZE/BANDAGES/DRESSINGS) ×1 IMPLANT
CEMENT BONE R 1X40 (Cement) ×2 IMPLANT
CEMENT VACUUM MIXING SYSTEM (MISCELLANEOUS) ×1 IMPLANT
CHLORAPREP W/TINT 26 (MISCELLANEOUS) ×1 IMPLANT
COMPONENT FEM CMT PRSONA SZ7RT (Joint) IMPLANT
COMPONENT PAT3 PEG SERS 31/6.2 (Miscellaneous) IMPLANT
COMPONENT TIB CMT PS D 0D RT (Joint) IMPLANT
COOLER POLAR GLACIER W/PUMP (MISCELLANEOUS) ×1 IMPLANT
COVER MAYO STAND STRL (DRAPES) ×1 IMPLANT
CUFF TRNQT CYL 24X4X16.5-23 (TOURNIQUET CUFF) IMPLANT
CUFF TRNQT CYL 34X4.125X (TOURNIQUET CUFF) IMPLANT
DRAPE IMP U-DRAPE 54X76 (DRAPES) ×1 IMPLANT
DRAPE SHEET LG 3/4 BI-LAMINATE (DRAPES) ×1 IMPLANT
DRAPE U-SHAPE 47X51 STRL (DRAPES) ×1 IMPLANT
DRSG MEPILEX SACRM 8.7X9.8 (GAUZE/BANDAGES/DRESSINGS) IMPLANT
DRSG OPSITE POSTOP 4X10 (GAUZE/BANDAGES/DRESSINGS) ×1 IMPLANT
DRSG OPSITE POSTOP 4X8 (GAUZE/BANDAGES/DRESSINGS) ×1 IMPLANT
ELECT CAUTERY BLADE 6.4 (BLADE) ×1 IMPLANT
ELECTRODE REM PT RTRN 9FT ADLT (ELECTROSURGICAL) ×1 IMPLANT
GAUZE XEROFORM 1X8 LF (GAUZE/BANDAGES/DRESSINGS) ×1 IMPLANT
GLOVE BIO SURGEON STRL SZ7.5 (GLOVE) ×4 IMPLANT
GLOVE BIO SURGEON STRL SZ8 (GLOVE) ×4 IMPLANT
GLOVE BIOGEL PI IND STRL 8 (GLOVE) ×1 IMPLANT
GLOVE INDICATOR 8.0 STRL GRN (GLOVE) ×1 IMPLANT
GOWN STRL REUS W/ TWL LRG LVL3 (GOWN DISPOSABLE) ×1 IMPLANT
GOWN STRL REUS W/ TWL XL LVL3 (GOWN DISPOSABLE) ×1 IMPLANT
HOOD PEEL AWAY T7 (MISCELLANEOUS) ×3 IMPLANT
INSERT TIB ASF PERS CD6-7 RT (Insert) IMPLANT
KIT TURNOVER KIT A (KITS) ×1 IMPLANT
MANIFOLD NEPTUNE II (INSTRUMENTS) ×1 IMPLANT
NDL SPNL 20GX3.5 QUINCKE YW (NEEDLE) ×1 IMPLANT
NEEDLE SPNL 20GX3.5 QUINCKE YW (NEEDLE) ×1 IMPLANT
NS IRRIG 500ML POUR BTL (IV SOLUTION) ×1 IMPLANT
PACK TOTAL KNEE (MISCELLANEOUS) ×1 IMPLANT
PAD WRAPON POLAR KNEE (MISCELLANEOUS) ×1 IMPLANT
PENCIL SMOKE EVACUATOR (MISCELLANEOUS) ×1 IMPLANT
PIN DRILL HDLS TROCAR 75 4PK (PIN) IMPLANT
SCREW FEMALE HEX FIX 25X2.5 (ORTHOPEDIC DISPOSABLE SUPPLIES) IMPLANT
SOL .9 NS 3000ML IRR UROMATIC (IV SOLUTION) ×1 IMPLANT
STAPLER SKIN PROX 35W (STAPLE) ×1 IMPLANT
STOCKINETTE IMPERV 14X48 (MISCELLANEOUS) ×1 IMPLANT
SUCTION TUBE FRAZIER 10FR DISP (SUCTIONS) ×1 IMPLANT
SUT VIC AB 0 CT1 36 (SUTURE) ×3 IMPLANT
SUT VIC AB 2-0 CT1 TAPERPNT 27 (SUTURE) ×3 IMPLANT
SYR 10ML LL (SYRINGE) ×1 IMPLANT
SYR 20ML LL LF (SYRINGE) ×1 IMPLANT
SYR 30ML LL (SYRINGE) IMPLANT
TIP FAN IRRIG PULSAVAC PLUS (DISPOSABLE) ×1 IMPLANT
TRAP FLUID SMOKE EVACUATOR (MISCELLANEOUS) ×1 IMPLANT
WATER STERILE IRR 500ML POUR (IV SOLUTION) ×1 IMPLANT

## 2023-08-11 NOTE — H&P (Signed)
 History of Present Illness: Angela Suarez is a 73 y.o. female who presents today for repeat evaluation of ongoing right knee pain. The patient has been evaluated the past and diagnosed with right knee osteoarthritic changes, at 1 point the patient was scheduled to undergo a right total knee arthroplasty however the patient did have other medical conditions, and she decided to delay surgery. The patient does have a history of chronic ongoing back pain. The patient states that the right knee is beginning to interfere with her day-to-day activities and she would like to discuss the possibility of surgery again at this time. She denies any surgical history to the right knee. She continues to report an aching throbbing pain in the right knee. She denies any numbness or ting of the right lower extremity today's visit. The patient denies any personal history of heart attack, she does have a history of stroke and does take Plavix. She does report history of COPD, she does still smoke. She is not diabetic.  Answers submitted by the patient for this visit: Right Knee HPI Questionnaire (Submitted on 07/15/2023) Chief Complaint: HPI - Right Knee How did your symptoms begin?: gradually without injury How long ago did your symptoms begin?: 2 Years Please indicate all symptoms related to your issue: giving way, instability, pain, weakness Where is your pain located? (select all that apply): knee cap region Please select all the associated symptoms of your problem: right hip pain, left hip pain If pain is present, please choose what best describes your pain (select all that apply): aching, radiating, sharp If pain is present, please choose the severity that best describes your pain: moderate (active but had to make modifications or give up activities) Rate the pain on a scale of 0 (no pain) to 10 (worst pain ever): 7/10 How often does the pain occur?: intermittent Since the onset of your problem, has the pain improved,  worsened, or stayed the same?: not changing Does your knee pain prevent you from performing your hobbies?: No Are you able to perform sports at the same level as before the injury?: No Does your knee feel grossly unstable (can't put weight on it)?: Yes Do your symptoms wake you from sleep?: Yes Rate your current activity of daily living as % of normal: 50% Have you had prior medical provider visits for this problem? (choose all that apply): primary care clinic, chiropractor, other Other medical provider visit - please explain: [facility] Please choose all the items that improve your symptoms: ice, injections Please choose all the items that worsen your symptoms: exercise, rising from a chair, standing, stooping, using stairs, walking, weight bearing Please select any aids that you use to ambulate (choose all that apply): uses a brace, uses a cane or walker What is your current ambulatory (walking) status?: can ambulate indoors only What is your current activity level?: no regular exercise Please indicate all the studies you have had for this problem (choose all that apply): x-rays, CT scan Questionnaire about: HPI - Right Knee (Submitted on 07/15/2023) Chief Complaint: HPI - Right Knee  Past Medical History: Allergic rhinitis  Allergy 2000  Anxiety and depression (chronic)  Arthritis in knees  Cancer (CMS/HHS-HCC)  melanoma in situ skin cancer L chest 2012  Cervical disc disease  COPD (chronic obstructive pulmonary disease) (CMS/HHS-HCC) - with continued tobacco abuse  Coronary artery calcification seen on CT scan 06/10/2021  CVA (cerebral vascular accident) (CMS/HHS-HCC) - possible;new left hemisensory symptoms 3/14;MRI with new diffusion restricting  lesion in right thalamus and right putamen.evaluated by Neurology  Depression just because of new pain in legs  Emphysema of lung (CMS/HHS-HCC) 12/06/2016 (mild)  Essential hypertension 08/14/2014  GERD  (gastroesophageal reflux disease)  Gross hematuria  History of cataract (slight at this time)  History of colon polyps  Irritable bowel syndrome (IBS)  Lumbar disc disease  Lupus anticoagulant positive 7/15  Menopausal syndrome  Multiple sclerosis (CMS/HHS-HCC) - follwed by Dr Lafe Pies at [facility]  Osteoporosis, 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 Daun Epstein)  Colon @ [facility] 05/07/2022 (Tubular adenoma/PHx CP/Repeat 70yrs/TKT)  EGD @ [facility] 05/07/2022 (Gastritis/Gastric ulcer/Repeat 6 months/TKT)  BREAST SURGERY right breast lump removal bengin 1970s  two breast biopsies  carcinoma removal back and forehead sometime 1980s  COLONOSCOPY 05/07/2011, 10/16/2005 (FH Colon Polyps (Father): CBF 04/2016)  HYSTERECTOMY partial hysterectomy 1993  neck surgery 1997 N/A back of neck at c4 disk  TONSILLECTOMY in 4th grade  vocal cord polyp (removed in past)   Past Family History: Asthma Mother  Breast cancer Mother - deceased  COPD Mother - deceased  Lung cancer Mother - deceased  Cancer Mother (lung)  Asthma Brother  COPD Father  Colon polyps Father  Lung cancer Father - deceased  Brain cancer Father  Cancer Father  Lung cancer Other  Kidney cancer Neg Hx   Medications: acetaminophen  (TYLENOL ) 500 MG tablet Take 1,000 mg by mouth as needed for Pain  amLODIPine (NORVASC) 5 MG tablet take 1 tablet by mouth once daily 30 tablet 11  atorvastatin (LIPITOR) 20 MG tablet TAKE 1 TABLET BY MOUTH ONCE EVERY EVENING 90 tablet 3  cholecalciferol (VITAMIN D3) 2,000 unit capsule Take 2,000 Units by mouth once daily  clonazePAM (KLONOPIN) 1 MG tablet TAKE 1/2 TABLET BY MOUTH ONCE DAILY AND  3 TABLETS AT BEDTIME 105 tablet 5  cyanocobalamin (VITAMIN B12) 1000 MCG tablet Take 1,000 mcg by mouth once daily  lidocaine  (LMX) 5 % cream Apply topically once daily as needed 28 g 5  pantoprazole (PROTONIX) 20 MG DR tablet Take 1 tablet (20 mg total) by mouth once daily 90 tablet 3  pregabalin (LYRICA) 150 MG capsule TAKE 1 CAPSULE BY MOUTH AT BEDTIME 90 capsule 1  pregabalin (LYRICA) 75 MG capsule Take 1 capsule (75 mg total) by mouth 3 (three) times daily (Patient taking differently: Take 75 mg by mouth as directed 1 at lunch and 2 at bedtime) 90 capsule 5  sertraline (ZOLOFT) 100 MG tablet TAKE 1 and 1/2 TABLETS BY MOUTH ONCE DAILY 135 tablet 3  triamcinolone (NASACORT AQ) 55 mcg nasal spray Place into one nostril  triamcinolone 0.1 % cream Apply topically 2 (two) times daily as needed 15 g 3  aspirin  325 MG EC tablet Take 325 mg by mouth once daily  traMADoL (ULTRAM) 50 mg tablet Take 1 tablet (50 mg total) by mouth 2 (two) times daily as needed for Pain 20 tablet 0   Allergies: Prevacid [Lansoprazole] Diarrhea (Pt states that she had "extreme diarrhea" after taking)  Codeine Nausea And Vomiting  Duloxetine Other (Disturbed dreams) Flagyl [Metronidazole Hcl] (Rash, Itching)  Hydrocodone  Other (Ankle swelling)  Lamisil [Terbinafine Hcl] Rash  Other Other (Cant take drugs that cause PML)  Oxycodone Vomiting  Prilosec [Omeprazole] Diarrhea  Tolmetin  Diarrhea  Tramadol Other (Ankle swelling)  Vicodin [Hydrocodone -Acetaminophen ] Nausea   Review of Systems:  A comprehensive 14 point ROS was performed, reviewed by me today, and the pertinent orthopaedic findings are documented in the HPI.  Physical Exam: BP 112/78  Ht 157.5 cm (5\' 2" )  Wt 62.3 kg (137 lb 6.4 oz)  LMP 08/12/1991  BMI 25.13 kg/m  General/Constitutional: The patient appears to be well-nourished, well-developed, and in no acute distress. Neuro/Psych: Normal mood and affect, oriented to person, place and time. Eyes:  Non-icteric. Pupils are equal, round, and reactive to light, and exhibit synchronous movement. ENT: Unremarkable. Lymphatic: No palpable adenopathy. Respiratory: Lungs clear to auscultation, Normal chest excursion, No wheezes, and Non-labored breathing Cardiovascular: Regular rate and rhythm. No murmurs. and No edema, swelling or tenderness, except as noted in detailed exam. Integumentary: No impressive skin lesions present, except as noted in detailed exam. Musculoskeletal: Unremarkable, except as noted in detailed exam.  General: Well developed, well nourished 73 y.o. female in no apparent distress. Normal affect. Normal communication. Patient answers questions appropriately. The patient has a normal gait. There is no antalgic component. There is no hip lurch.   Bilateral knees: Examination of bilateral knees demonstrates no bony abnormality, edema or effusion no ecchymosis.. There is no valgus or varus abnormality. The patient is mildly tender along the lateral joint line, and is mildly tender along the medial joint line. The patient has full knee extension and is able to flex 110 degrees with significant patellofemoral crepitus in bilateral knees. There is moderate-severe discomfort with range of motion exercises. The patient has a negative rotational Mcmurray test. There is moderate retropatellar discomfort. The patient has a negative patella stretch test. The patient has a negative varus stress test and a negative valgus stress test, in looking for stability. The patient has a negative Lachman's test.  Vascular: The patient has a negative Celine Collard' test bilaterally. The patient had a normal dorsalis pedis and posterior tibial pulse. There is normal skin warmth. There is normal capillary refill bilaterally.   Neurologic: The patient has a negative straight leg raise. The patient has normal muscle strength testing for the quadriceps, calves, ankle dorsiflexion, ankle plantarflexion, and extensor  hallicus longus. The patient has sensation that is intact to light touch. The deep tendon reflexes are normal at the patella and achilles. No clonus is noted.   Imaging: AP, lateral and sunrise views of the right knee were obtained at today's visit and reviewed by me. These x-rays demonstrate moderate to severe osteoarthritic changes along the right knee. Subchondral sclerosis noted to both the medial and lateral compartment. There is complete loss of patellofemoral joint space with osteophyte formation off the lateral and superior aspect of the patella. No evidence of acute fracture. No evidence of dislocation or lytic lesion.  Impression: Primary osteoarthritis of right knee.  Plan:  1. Treatment options were discussed today with the patient. 2. The patient continues to experience pain in the right knee due to underlying severe right knee osteoarthritic changes. 3. The patient was instructed on the risk and benefits of a right total knee arthroplasty and would like to proceed with surgery at this time. 4. The patient does still have ongoing chronic low back pain, a referral to a pain management clinic was placed for the patient. A short supply of tramadol was prescribed for her ongoing right knee and low back pain at this time, she was instructed not to take this close to her Klonopin which she takes at  night. 5. This document will serve as a surgical history and physical for the patient if she does proceed with a right total knee arthroplasty. 6. She can contact the clinic if she has any questions, new symptoms develop or symptoms worsen.  The procedure was discussed with the patient, as were the potential risks (including bleeding, infection, nerve and/or blood vessel injury, persistent or recurrent pain, failure of the hardware, instability, need for further surgery, blood clots, strokes, heart attacks and/or arhythmias, pneumonia, etc.) and benefits. The patient states her understanding and  wishes to proceed.    H&P reviewed and patient re-examined. No changes.

## 2023-08-11 NOTE — Anesthesia Procedure Notes (Signed)
 Procedure Name: Intubation Date/Time: 08/11/2023 7:43 AM  Performed by: Ellwood Haber, CRNAPre-anesthesia Checklist: Patient identified, Patient being monitored, Timeout performed, Emergency Drugs available and Suction available Patient Re-evaluated:Patient Re-evaluated prior to induction Oxygen Delivery Method: Circle system utilized Preoxygenation: Pre-oxygenation with 100% oxygen Induction Type: IV induction Ventilation: Mask ventilation without difficulty Laryngoscope Size: 3 and McGrath Grade View: Grade I Tube type: Oral Tube size: 6.5 mm Number of attempts: 1 Airway Equipment and Method: Stylet Placement Confirmation: ETT inserted through vocal cords under direct vision, positive ETCO2 and breath sounds checked- equal and bilateral Secured at: 21 cm Tube secured with: Tape Dental Injury: Teeth and Oropharynx as per pre-operative assessment  Comments: Smooth atraumatic intubation, no complications noted.

## 2023-08-11 NOTE — Plan of Care (Signed)

## 2023-08-11 NOTE — Discharge Instructions (Signed)
 [facility].  [address] , [place], [place], [address]. [phone] They will call you to arrange when they can come to see you

## 2023-08-11 NOTE — Op Note (Signed)
 08/11/2023  10:11 AM  Patient:   Angela Suarez  Pre-Op Diagnosis:   Degenerative joint disease, right knee.  Post-Op Diagnosis:   Same  Procedure:   Right TKA using all-cemented Zimmer Persona system with a #7 PCR femur, a(n) D-sized  tibial tray with an 11 mm medial congruent E-poly insert, and a 31 x 6.2 mm all-poly Vanguard 3-pegged domed patella.  Surgeon:   Lonnie Roberts, MD  Assistant:   [facility] Brent, RNFA; Russell Court, PA-S  Anesthesia:   GET  Findings:   As above  Complications:   None  EBL:   10 cc  Fluids:   300 cc crystalloid  UOP:   None  TT:   90 minutes at 300 mmHg  Drains:   None  Closure:   Staples  Implants:   As above  Brief Clinical Note:   The patient is a 73 year old female with a long history of progressively worsening right knee pain. The patient's symptoms have progressed despite medications, activity modification, injections, etc. The patient's history and examination were consistent with advanced degenerative joint disease of the right knee confirmed by plain radiographs. The patient presents at this time for a right total knee arthroplasty.  Procedure:   The patient was brought into the operating room and lain in the supine position. After adequate general endotracheal intubation and anesthesia was obtained, the right lower extremity was prepped with ChloraPrep solution and draped sterilely. Preoperative antibiotics were administered. A timeout was performed to verify the appropriate surgical site before the limb was exsanguinated with an Esmarch and the tourniquet inflated to 300 mmHg.   A standard anterior approach to the knee was made through an approximately 6-7 inch incision. The incision was carried down through the subcutaneous tissues to expose superficial retinaculum. This was split the length of the incision and the medial flap elevated sufficiently to expose the medial retinaculum. The medial retinaculum was incised, leaving a 3-4  mm cuff of tissue on the patella. This was extended distally along the medial border of the patellar tendon and proximally through the medial third of the quadriceps tendon. A subtotal fat pad excision was performed before the soft tissues were elevated off the anteromedial and anterolateral aspects of the proximal tibia to the level of the collateral ligaments. The anterior portions of the medial and lateral menisci were removed, as was the anterior cruciate ligament. With the knee flexed to 90, the external tibial guide was positioned and the appropriate proximal tibial cut made. This piece was taken to the back table where it was measured and found to be optimally replicated by a(n) D-sized component.  Attention was directed to the distal femur. The intramedullary canal was accessed through a 3/8" drill hole. The intramedullary guide was inserted and placed at 5 of valgus alignment. Using the +0 slot, the distal cut was made. The distal femur was measured and found to be optimally replicated by the #7 component. The #7 4-in-1 cutting block was positioned and first the posterior, then the posterior chamfer, the anterior, and finally the anterior chamfer cuts were made after verifying that the anterior cortex would not be notched.   At this point, the posterior portions medial and lateral menisci were removed. A trial reduction was performed using the appropriate femoral and tibial components with  first the 10 mm and then the 11 mm  insert. The 11 mm insert demonstrated excellent stability to varus and valgus stressing both in flexion and extension while permitting  full extension. Patellar tracking was assessed and found to be excellent. Therefore, the tibial trial position was marked on the proximal tibia. The patella thickness was measured and found to be 17 mm. Therefore, the appropriate cut was made. The patellar surface was measured and found to be optimally replicated by the  31  mm Vanguard component.  The three peg holes were drilled in place before the trial button was inserted. Patella tracking was assessed and found to be excellent, passing the "no thumb test". The lug holes were drilled into the distal femur before the trial component was removed.  The tibial tray was repositioned before the keel was created using the appropriate tower, reamer, and punch.  The bony surfaces were prepared for cementing by irrigating them thoroughly with sterile saline solution via the jet lavage system. A bone plug was fashioned from some of the bone that had been removed previously and used to plug the distal femoral canal. In addition, a "cocktail" of 20 cc of Exparel , 30 cc of 0.5% Sensorcaine , 2 cc of Kenalog 40 (80 mg), and 30 mg of Toradol  diluted out to 90 cc with normal saline was injected into the postero-medial and postero-lateral aspects of the knee, the medial and lateral gutter regions, and the peri-incisional tissues to help with postoperative analgesia. Meanwhile, the cement was being mixed on the back table.   When the cement was ready, the tibial tray was cemented in first. The excess cement was removed using Personal assistant. Next, the femoral component was impacted into place. Again, the excess cement was removed using Personal assistant. The  11 mm  trial insert was positioned and the knee brought into extension while the cement hardened. Finally, the patella was cemented into place and secured using the patellar clamp. Again, the excess cement was removed using Personal assistant. Once the cement had hardened, the knee was placed through a range of motion with the findings as described above. Therefore, the trial insert was removed and, after verifying that no cement had been retained posteriorly, the permanent 11 mm visual congruent E-polyethylene insert was snapped into place with care taken to ensure appropriate locking of the insert. Again the knee was placed through a range of motion with the findings as  described above.  The wound was copiously irrigated with sterile saline solution using the jet lavage system before the quadriceps tendon and retinacular layer were reapproximated using #0 Vicryl interrupted sutures. The superficial retinacular layer also was closed using a running #0 Vicryl suture. The subcutaneous tissues were closed in several layers using 2-0 Vicryl interrupted sutures. The skin was closed using staples. A sterile honeycomb dressing was applied to the skin before the leg was wrapped with an Ace wrap to accommodate the Polar Care device. The patient was then awakened, extubated, and returned to the recovery room in satisfactory condition after tolerating the procedure well.

## 2023-08-11 NOTE — Anesthesia Preprocedure Evaluation (Signed)
 Anesthesia Evaluation  Patient identified by MRN, date of birth, ID band Patient awake    Reviewed: Allergy & Precautions, H&P , NPO status , Patient's Chart, lab work & pertinent test results, reviewed documented beta blocker date and time   Airway Mallampati: II  TM Distance: >3 FB Neck ROM: full    Dental  (+) Teeth Intact   Pulmonary COPD, Current Smoker and Patient abstained from smoking.   Pulmonary exam normal        Cardiovascular Exercise Tolerance: Poor hypertension, On Medications (-) angina + CAD and + Peripheral Vascular Disease  (-) CHF, (-) Orthopnea and (-) DVT Normal cardiovascular exam Rhythm:regular Rate:Normal     Neuro/Psych  Headaches PSYCHIATRIC DISORDERS Anxiety Depression    CVA, No Residual Symptoms    GI/Hepatic Neg liver ROS, PUD,GERD  Medicated,,  Endo/Other  negative endocrine ROS    Renal/GU negative Renal ROS  negative genitourinary   Musculoskeletal   Abdominal   Peds  Hematology negative hematology ROS (+)   Anesthesia Other Findings Past Medical History: No date: Allergic rhinitis No date: Anxiety No date: Arthritis No date: Cancer [facility])     Comment:  skin No date: Cervical disc disease No date: 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 No date: COPD (chronic obstructive pulmonary disease) (HCC)     Comment:  mild-no inhalers No date: Coronary artery calcification seen on CAT scan No date: Depression No date: GERD (gastroesophageal reflux disease) No date: Gross hematuria No date: Headache     Comment:  Used to have migraines, now sinus HA No date: History of kidney stones No date: Hypertension No date: IBS (irritable bowel syndrome) No date: Lumbar disc disease No date: Lupus anticoagulant disorder (HCC) No date: Lupus anticoagulant positive No date: Melanoma in situ (HCC) No date: Multiple sclerosis (HCC)     Comment:  left leg weakness No date:  Osteoarthritis of right knee No date: Senile purpura (HCC) No date: Smokers' cough (HCC) No date: Spinal stenosis, lumbar No date: Stroke [facility])     Comment:  06/2012, Mild left weakness, numbness to lip, cheek, left              fingers No date: Thoracic aortic atherosclerosis (HCC) No date: Thoracic disc disease No date: Thrombocytopenia (HCC) No date: Vitamin B 12 deficiency No date: Vitamin D deficiency Past Surgical History: No date: ABDOMINAL HYSTERECTOMY 1997: BACK SURGERY     Comment:  Cervical spine 1990: BREAST EXCISIONAL BIOPSY; Right     Comment:  x 2, neg No date: BREAST SURGERY 06/12/2022: CATARACT EXTRACTION W/PHACO; Left     Comment:  Procedure: CATARACT EXTRACTION PHACO AND INTRAOCULAR               LENS PLACEMENT (IOC) LEFT CLAREON TORIC 8.14 00:43.0;                Surgeon: Trudi Fus, MD;  Location: [facility]               SURGERY CNTR;  Service: Ophthalmology;  Laterality: Left; 06/26/2022: CATARACT EXTRACTION W/PHACO; Right     Comment:  Procedure: CATARACT EXTRACTION PHACO AND INTRAOCULAR               LENS PLACEMENT (IOC) RIGHT CLAREON TORIC 7.23 00:46.0;                Surgeon: Trudi Fus, MD;  Location: [facility]               SURGERY CNTR;  Service: Ophthalmology;  Laterality:               Right; No date: colon polyps No date: colonoscipy 05/07/2022: COLONOSCOPY; N/A     Comment:  Procedure: COLONOSCOPY;  Surgeon: Toledo, Alphonsus Jeans, MD;              Location: [facility] ENDOSCOPY;  Service: Endoscopy;                Laterality: N/A; 09/15/2016: COLONOSCOPY WITH PROPOFOL ; N/A     Comment:  Procedure: COLONOSCOPY WITH PROPOFOL ;  Surgeon: Cassie Click, MD;  Location: [facility] ENDOSCOPY;  Service:               Endoscopy;  Laterality: N/A; 05/07/2022: ESOPHAGOGASTRODUODENOSCOPY; N/A     Comment:  Procedure: ESOPHAGOGASTRODUODENOSCOPY (EGD);  Surgeon:               Toledo, Alphonsus Jeans, MD;  Location: [facility] ENDOSCOPY;                 Service: Endoscopy;  Laterality: N/A; No date: FRACTURE SURGERY 12/06/2020: ORIF HUMERUS FRACTURE; Left     Comment:  Procedure: OPEN REDUCTION INTERNAL FIXATION (ORIF)               PROXIMAL HUMERUS GREATER TUBEROSITY FRACTURE.;  Surgeon:               Elner Hahn, MD;  Location: [facility] ORS;  Service:               Orthopedics;  Laterality: Left; 1990: POLYPECTOMY; Right     Comment:  throat polyp removed by Dr Gerrie Krebs.  No date: TONSILLECTOMY BMI    Body Mass Index: 24.69 kg/m     Reproductive/Obstetrics negative OB ROS                             Anesthesia Physical Anesthesia Plan  ASA: 3  Anesthesia Plan: General ETT   Post-op Pain Management:    Induction:   PONV Risk Score and Plan: 3  Airway Management Planned:   Additional Equipment:   Intra-op Plan:   Post-operative Plan:   Informed Consent: I have reviewed the patients History and Physical, chart, labs and discussed the procedure including the risks, benefits and alternatives for the proposed anesthesia with the patient or authorized representative who has indicated his/her understanding and acceptance.     Dental Advisory Given  Plan Discussed with: CRNA  Anesthesia Plan Comments: (Secondary to issues with MS and chronic low back pain, will proceed with GOT and avoid SAB per patient request. ja)       Anesthesia Quick Evaluation

## 2023-08-11 NOTE — Evaluation (Signed)
 Physical Therapy Evaluation Patient Details Name: Angela Suarez MRN: [id number] DOB: June 25, 1950 Today's Date: 08/11/2023  History of Present Illness  Pt is s/p elective R TKA on 08/11/23.  Clinical Impression  Pt admitted with above diagnosis. Pt currently with functional limitations due to the deficits listed below (see PT Problem List). Pt received supine agreeable to PT. Pt drowsy stating due to pain meds. Husband present and assists with subjective reporting. At baseline pt is unsteady due to her MS and uses a 3 wheeled walker for the community and no AD in household.   To date, pt endorsing need to urinate. Able to exit bed minA with multimodal cuing for UE/LE sequencing. CGA to stand to RW with bouts of momentum. Pt initially with poor safety awareness with x2 R LOB during SPT to [facility] leading to minA from PT to correct. Pt is able to urinate and perform pericare independently. Much improved STS and SPT back to bed with no LOB and able to return to supine with all needs in reach. Reviewed HEP and discussed with pt and spouse on R knee positioning to prevent contracture and educated on polar care use. Pending stairs training and gait progression anticipate pt will be safe to d/c with f/u recs as listed to address acute deficits in knee ROM, gait abnormality, and strength.      If plan is discharge home, recommend the following: A little help with walking and/or transfers;A little help with bathing/dressing/bathroom;Assist for transportation;Help with stairs or ramp for entrance   Can travel by private vehicle        Equipment Recommendations Rolling walker (2 wheels);BSC/3in1  Recommendations for Other Services       Functional Status Assessment Patient has had a recent decline in their functional status and demonstrates the ability to make significant improvements in function in a reasonable and predictable amount of time.     Precautions / Restrictions Precautions Precautions:  Fall;Knee Precaution Booklet Issued: Yes (comment) Recall of Precautions/Restrictions: Intact Restrictions Weight Bearing Restrictions Per Provider Order: Yes RLE Weight Bearing Per Provider Order: Weight bearing as tolerated      Mobility  Bed Mobility Overal bed mobility: Needs Assistance Bed Mobility: Supine to Sit     Supine to sit: Supervision, HOB elevated       Patient Response: Cooperative  Transfers Overall transfer level: Needs assistance Equipment used: Rolling walker (2 wheels) Transfers: Sit to/from Stand, Bed to chair/wheelchair/BSC Sit to Stand: Contact guard assist   Step pivot transfers: Contact guard assist, Min assist       General transfer comment: VC's for hand placement. x2 small LOB to the R needing minA and VC's to correct. Pt unaware. Unsure if this is due to drowsiness from medication?    Ambulation/Gait                  Stairs            Wheelchair Mobility     Tilt Bed Tilt Bed Patient Response: Cooperative  Modified Rankin (Stroke Patients Only)       Balance Overall balance assessment: Needs assistance Sitting-balance support: Bilateral upper extremity supported, Feet supported Sitting balance-Leahy Scale: Good     Standing balance support: Bilateral upper extremity supported Standing balance-Leahy Scale: Fair Standing balance comment: using RW                             Pertinent Vitals/Pain Pain Assessment Pain Assessment:  Faces Faces Pain Scale: Hurts a little bit Pain Location: R knee Pain Descriptors / Indicators: Aching, Discomfort Pain Intervention(s): Limited activity within patient's tolerance, Monitored during session, Repositioned    Home Living Family/patient expects to be discharged to:: Private residence Living Arrangements: Spouse/significant other Available Help at Discharge: Family;Available 24 hours/day Type of Home: House Home Access: Stairs to enter Entrance  Stairs-Rails: Right;Left;Can reach both Entrance Stairs-Number of Steps: 3   Home Layout: One level Home Equipment: Rollator (4 wheels);Tub bench Additional Comments: 3 wheeled walker    Prior Function Prior Level of Function : Independent/Modified Independent             Mobility Comments: Uses 3WW at baseline in community       Extremity/Trunk Assessment   Upper Extremity Assessment Upper Extremity Assessment: Overall WFL for tasks assessed    Lower Extremity Assessment Lower Extremity Assessment: Generalized weakness       Communication   Communication Communication: No apparent difficulties    Cognition Arousal: Alert Behavior During Therapy: WFL for tasks assessed/performed   PT - Cognitive impairments: No apparent impairments                       PT - Cognition Comments: drowsy suspect due to pain meds but able to participate and follow commands. Following commands: Intact       Cueing Cueing Techniques: Verbal cues, Tactile cues     General Comments      Exercises Total Joint Exercises Ankle Circles/Pumps: AROM, Strengthening, Both, 10 reps, Supine Quad Sets: AROM, Strengthening, Right, 10 reps, Supine Heel Slides: AROM, Strengthening, Right, 5 reps, Supine Long Arc Quad: AROM, Strengthening, 10 reps, Seated Other Exercises Other Exercises: WB status, knee positioning to prevent knee flexor contracture, HEP (reps/sets/frequency)   Assessment/Plan    PT Assessment Patient needs continued PT services  PT Problem List Decreased strength;Decreased mobility;Decreased safety awareness;Decreased balance       PT Treatment Interventions DME instruction;Therapeutic exercise;Gait training;Balance training;Stair training;Neuromuscular re-education;Therapeutic activities;Functional mobility training;Patient/family education    PT Goals (Current goals can be found in the Care Plan section)  Acute Rehab PT Goals Patient Stated Goal: to go  home PT Goal Formulation: With patient/family Time For Goal Achievement: 08/25/23 Potential to Achieve Goals: Good    Frequency BID     Co-evaluation               AM-PAC PT "6 Clicks" Mobility  Outcome Measure Help needed turning from your back to your side while in a flat bed without using bedrails?: A Little Help needed moving from lying on your back to sitting on the side of a flat bed without using bedrails?: A Little Help needed moving to and from a bed to a chair (including a wheelchair)?: A Little Help needed standing up from a chair using your arms (e.g., wheelchair or bedside chair)?: A Little Help needed to walk in hospital room?: A Lot Help needed climbing 3-5 steps with a railing? : A Lot 6 Click Score: 16    End of Session Equipment Utilized During Treatment: Gait belt Activity Tolerance: Patient tolerated treatment well Patient left: in bed;with call bell/phone within reach;with bed alarm set;with nursing/sitter in room;with family/visitor present Nurse Communication: Mobility status PT Visit Diagnosis: Unsteadiness on feet (R26.81);Muscle weakness (generalized) (M62.81);Pain Pain - Right/Left: Right Pain - part of body: Knee    Time: 0454-0981 PT Time Calculation (min) (ACUTE ONLY): 34 min   Charges:   PT Evaluation $  PT Eval Low Complexity: 1 Low PT Treatments $Therapeutic Exercise: 8-22 mins PT General Charges $$ ACUTE PT VISIT: 1 Visit       Marc Senior. Fairly IV, PT, DPT Physical Therapist- [place]  [facility]  08/11/2023, 3:49 PM

## 2023-08-11 NOTE — Transfer of Care (Signed)
 Immediate Anesthesia Transfer of Care Note  Patient: Angela Suarez  Procedure(s) Performed: ARTHROPLASTY, KNEE, TOTAL (Right: Knee)  Patient Location: PACU  Anesthesia Type:General  Level of Consciousness: drowsy  Airway & Oxygen Therapy: Patient Spontanous Breathing and Patient connected to face mask oxygen  Post-op Assessment: Report given to RN and Post -op Vital signs reviewed and stable  Post vital signs: Reviewed and stable  Last Vitals:  Vitals Value Taken Time  BP 110/57 08/11/23 0957  Temp 36.9 0956  Pulse 82 08/11/23 1000  Resp 15 08/11/23 1000  SpO2 98 % 08/11/23 1000  Vitals shown include unfiled device data.  Last Pain:  Vitals:   08/11/23 0629  TempSrc: Tympanic  PainSc: 4          Complications: No notable events documented.

## 2023-08-11 NOTE — TOC Progression Note (Signed)
 Transition of Care [facility]) - Progression Note    Patient Details  Name: Angela Suarez MRN: [id number] Date of Birth: Nov 05, 1950  [facility]) CM/SW Contact  Alexandra Ice, RN Phone Number: 08/11/2023, 1:26 PM  Clinical Narrative:     Patient set up with [facility] by surgeon's office prior to surgeon, added information to AVS.        Expected Discharge Plan and Services                                               Social Determinants of Health (SDOH) Interventions SDOH Screenings   Food Insecurity: No Food Insecurity (12/24/2022)   Received from [facility]  Housing: Low Risk  (05/13/2023)   Received from [facility]  Transportation Needs: No Transportation Needs (12/24/2022)   Received from [facility]  Utilities: Not At Risk (12/24/2022)   Received from [facility]  Financial Resource Strain: Low Risk  (12/24/2022)   Received from [facility]  Tobacco Use: High Risk (08/11/2023)    Readmission Risk Interventions     No data to display

## 2023-08-12 ENCOUNTER — Encounter: Payer: Self-pay | Admitting: Surgery

## 2023-08-12 DIAGNOSIS — M1711 Unilateral primary osteoarthritis, right knee: Secondary | ICD-10-CM | POA: Diagnosis not present

## 2023-08-12 MED ORDER — HYDROMORPHONE HCL 2 MG PO TABS
1.0000 mg | ORAL_TABLET | ORAL | 0 refills | Status: AC | PRN
Start: 2023-08-12 — End: ?

## 2023-08-12 MED ORDER — APIXABAN 2.5 MG PO TABS: 2.5000 mg | ORAL_TABLET | Freq: Two times a day (BID) | ORAL | 0 refills | Status: DC

## 2023-08-12 MED ORDER — ONDANSETRON HCL 4 MG PO TABS: 4.0000 mg | ORAL_TABLET | Freq: Four times a day (QID) | ORAL | 0 refills | Status: DC | PRN

## 2023-08-12 NOTE — TOC Transition Note (Signed)
 Transition of Care [facility]) - Discharge Note   Patient Details  Name: Angela Suarez MRN: [id number] Date of Birth: 07/21/1950  Transition of Care [facility]) CM/SW Contact:  Crayton Docker, RN 08/12/2023, 11:08 AM   Clinical Narrative:     Discharge orders noted for home. Noted, home health and HHA and DME orders for rolling walker and 3 in 1 bedside commode. CM call to [facility] regarding home health orders. Per Bartholomew Light, agency has accepted patient for home health services with a start of care of tomorrow, 08/13/2023. CM call to patient's husband, Sylvester Evert regarding [facility] and start of care of tomorrow. CM secure message to Sam Creighton, [employer] regarding DME orders and request for bedside delivery.  Final next level of care: Home w Home Health Services Barriers to Discharge: No Barriers Identified   Patient Goals and CMS Choice    Home with home health PT     Discharge Placement       Home with home health PT           Discharge Plan and Services Additional resources added to the After Visit Summary for                 DME Arranged: 3-N-1, Walker rolling DME Agency: [employer] Date DME Agency Contacted: 08/12/23 Time DME Agency Contacted: [phone] Representative spoke with at DME Agency: Montie Apley Agency: [facility] Date [facility] Agency Contacted: 08/12/23 Time HH Agency Contacted: [phone] Representative spoke with at [facility] Agency: Bartholomew Light  Social Drivers of Health (SDOH) Interventions SDOH Screenings   Food Insecurity: No Food Insecurity (08/11/2023)  Housing: Low Risk  (08/11/2023)  Transportation Needs: No Transportation Needs (08/11/2023)  Utilities: Not At Risk (08/11/2023)  Financial Resource Strain: Low Risk  (12/24/2022)   Received from [facility]  Social Connections: Unknown (08/11/2023)  Tobacco Use: High Risk (08/11/2023)     Readmission Risk Interventions     No data to display

## 2023-08-12 NOTE — Plan of Care (Signed)

## 2023-08-12 NOTE — Anesthesia Postprocedure Evaluation (Signed)
 Anesthesia Post Note  Patient: Angela Suarez  Procedure(s) Performed: ARTHROPLASTY, KNEE, TOTAL (Right: Knee)  Patient location during evaluation: PACU Anesthesia Type: General Level of consciousness: awake and alert Pain management: pain level controlled Vital Signs Assessment: post-procedure vital signs reviewed and stable Respiratory status: spontaneous breathing, nonlabored ventilation, respiratory function stable and patient connected to nasal cannula oxygen Cardiovascular status: blood pressure returned to baseline and stable Postop Assessment: no apparent nausea or vomiting Anesthetic complications: no   No notable events documented.   Last Vitals:  Vitals:   08/12/23 0351 08/12/23 0735  BP: 124/66 124/69  Pulse: 76 63  Resp: 16 16  Temp: (!) 36.4 C 36.8 C  SpO2: 93% 94%    Last Pain:  Vitals:   08/12/23 0814  TempSrc:   PainSc: 0-No pain                 Zula Hitch

## 2023-08-12 NOTE — Progress Notes (Signed)
  Subjective: 1 Day Post-Op Procedure(s) (LRB): ARTHROPLASTY, KNEE, TOTAL (Right) Patient reports pain as mild.   Patient is well, and has had no acute complaints or problems Plan is to go Home after hospital stay. Negative for chest pain and shortness of breath Fever: no Gastrointestinal:Negative for nausea and vomiting  Objective: Vital signs in last 24 hours: Temp:  [97.5 F (36.4 C)-98 F (36.7 C)] 97.5 F (36.4 C) (05/07 0351) Pulse Rate:  [74-85] 76 (05/07 0351) Resp:  [9-24] 16 (05/07 0351) BP: (105-146)/(56-76) 124/66 (05/07 0351) SpO2:  [89 %-100 %] 93 % (05/07 0351)  Intake/Output from previous day:  Intake/Output Summary (Last 24 hours) at 08/12/2023 0703 Last data filed at 08/11/2023 1835 Gross per 24 hour  Intake 1657.49 ml  Output 410 ml  Net 1247.49 ml    Intake/Output this shift: No intake/output data recorded.  Labs: No results for input(s): "HGB" in the last 72 hours. No results for input(s): "WBC", "RBC", "HCT", "PLT" in the last 72 hours. No results for input(s): "NA", "K", "CL", "CO2", "BUN", "CREATININE", "GLUCOSE", "CALCIUM" in the last 72 hours. No results for input(s): "LABPT", "INR" in the last 72 hours.   EXAM General - Patient is Alert, Appropriate, and Oriented Extremity - ABD soft Neurovascular intact Dorsiflexion/Plantar flexion intact Incision: scant drainage No cellulitis present Dressing/Incision - Mild bloody drainage noted to the right knee honeycomb dressing. Motor Function - intact, moving foot and toes well on exam.  Abdomen soft with intact bowel sounds.  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    COPD (chronic obstructive pulmonary disease) (HCC)    mild-no inhalers   Coronary artery calcification seen on CAT scan    Depression    GERD (gastroesophageal reflux disease)     Gross hematuria    Headache    Used to have migraines, now sinus HA   History of kidney stones    Hypertension    IBS (irritable bowel syndrome)    Lumbar disc disease    Lupus anticoagulant disorder (HCC)    Lupus anticoagulant positive    Melanoma in situ (HCC)    Multiple sclerosis (HCC)    left leg weakness   Osteoarthritis of right knee    Senile purpura (HCC)    Smokers' cough (HCC)    Spinal stenosis, lumbar    Stroke (HCC)    06/2012, Mild left weakness, numbness to lip, cheek, left fingers   Thoracic aortic atherosclerosis (HCC)    Thoracic disc disease    Thrombocytopenia (HCC)    Vitamin B 12 deficiency    Vitamin D deficiency     Assessment/Plan: 1 Day Post-Op Procedure(s) (LRB): ARTHROPLASTY, KNEE, TOTAL (Right) Principal Problem:   Status post total knee replacement using cement, right  Estimated body mass index is 24.69 kg/m as calculated from the following:   Height as of this encounter: 5\' 2"  (1.575 m).   Weight as of this encounter: 61.2 kg. Advance diet Up with therapy D/C IV fluids when tolerating po intake.  Vitals reviewed this AM. Up with therapy today. Plan for discharge home today pending progress with PT.  DVT Prophylaxis - TED hose and Eliquis Weight-Bearing as tolerated to right leg  J. Edie Goon, PA-C [facility] Orthopaedic Surgery 08/12/2023, 7:03 AM

## 2023-08-12 NOTE — Progress Notes (Signed)
 Physical Therapy Treatment Patient Details Name: Angela Suarez MRN: [id number] DOB: May 25, 1950 Today's Date: 08/12/2023   History of Present Illness Pt is s/p elective R TKA on 08/11/23.    PT Comments  Pt was just finishing OT upon start of PT session. PT is A and O x 4. Cooperative and hopeful for DC home today. She demonstrated safe performance of transfers, gait, and stairs to simulate home entry. Pt is cleared from an acute standpoint for safe DC home with HHPT to follow.     If plan is discharge home, recommend the following: A little help with walking and/or transfers;A little help with bathing/dressing/bathroom;Assist for transportation;Help with stairs or ramp for entrance     Equipment Recommendations  Rolling walker (2 wheels);BSC/3in1       Precautions / Restrictions Precautions Precautions: Fall;Knee Recall of Precautions/Restrictions: Intact Restrictions Weight Bearing Restrictions Per Provider Order: Yes RLE Weight Bearing Per Provider Order: Weight bearing as tolerated     Mobility  Bed Mobility Overal bed mobility: Modified Independent  Transfers Overall transfer level: Needs assistance Equipment used: Rolling walker (2 wheels) Transfers: Sit to/from Stand Sit to Stand: Contact guard assist, Min assist  Ambulation/Gait Ambulation/Gait assistance: Supervision Gait Distance (Feet): 150 Feet Assistive device: Rolling walker (2 wheels) Gait Pattern/deviations: Step-through pattern  General Gait Details: vcs throughout for upright posture. jno LOB during standing activity with BUE support in RW   Stairs Stairs: Yes Stairs assistance: Supervision Stair Management: Two rails, Step to pattern Number of Stairs: 4 General stair comments: no LOB or safety concern during stair performance   Balance Overall balance assessment: Needs assistance Sitting-balance support: Feet supported, No upper extremity supported Sitting balance-Leahy Scale: Good     Standing  balance support: No upper extremity supported, During functional activity Standing balance-Leahy Scale: Fair       Hotel manager: No apparent difficulties  Cognition Arousal: Alert Behavior During Therapy: WFL for tasks assessed/performed      Following commands: Intact      Cueing Cueing Techniques: Verbal cues, Tactile cues     General Comments General comments (skin integrity, edema, etc.): reviewed positioning, importance of ther ex HEP, and positioning.      Pertinent Vitals/Pain Pain Assessment Pain Assessment: No/denies pain    Home Living Family/patient expects to be discharged to:: Private residence Living Arrangements: Spouse/significant other Available Help at Discharge: Family;Available 24 hours/day Type of Home: House Home Access: Stairs to enter Entrance Stairs-Rails: Right;Left;Can reach both Entrance Stairs-Number of Steps: 3   Home Layout: One level Home Equipment: Rollator (4 wheels);Tub bench Additional Comments: 3 wheeled walker        PT Goals (current goals can now be found in the care plan section) Progress towards PT goals: Progressing toward goals    Frequency    BID       AM-PAC PT "6 Clicks" Mobility   Outcome Measure  Help needed turning from your back to your side while in a flat bed without using bedrails?: A Little Help needed moving from lying on your back to sitting on the side of a flat bed without using bedrails?: A Little Help needed moving to and from a bed to a chair (including a wheelchair)?: A Little Help needed standing up from a chair using your arms (e.g., wheelchair or bedside chair)?: A Little Help needed to walk in hospital room?: A Little Help needed climbing 3-5 steps with a railing? : A Little 6 Click Score: 18    End  of Session   Activity Tolerance: Patient tolerated treatment well Patient left: in chair;with call bell/phone within reach Nurse Communication: Mobility  status PT Visit Diagnosis: Unsteadiness on feet (R26.81);Muscle weakness (generalized) (M62.81);Pain Pain - Right/Left: Right Pain - part of body: Knee     Time: 0454-0981 PT Time Calculation (min) (ACUTE ONLY): 27 min  Charges:    $Gait Training: 8-22 mins $Therapeutic Activity: 8-22 mins PT General Charges $$ ACUTE PT VISIT: 1 Visit                     Chester Costa PTA 08/12/23, 10:27 AM

## 2023-08-12 NOTE — Evaluation (Signed)
 Occupational Therapy Evaluation Patient Details Name: Angela Suarez MRN: [id number] DOB: 12/07/1950 Today's Date: 08/12/2023   History of Present Illness   Pt is s/p elective R TKA on 08/11/23.     Clinical Impressions Ms Leichliter was seen for OT evaluation this date. Prior to hospital admission, pt was MOD I using 3WW as needed. Pt lives with spouse available 24/7. Pt currently requires initial MIN A sit<>stand from bed improving to CGA + RW to stand from [facility]. CGA + RW for toilet t/f and pericare. SETUP for UB dressing in sitting. SBA standing grooming tasks, 1 posterior LOB with overhead reaching requiring MIN A to correct. MAX A don compression socks, MIN A don shoes in sitting. Pt instructed in polar care mgt, falls prevention strategies, home/routines modifications, DME/AE for LB bathing/dressing tasks, and compression stocking mgt. Handout provided. All education complete, will sign off.  Upon hospital discharge, recommend no OT follow up.     If plan is discharge home, recommend the following:   Help with stairs or ramp for entrance;A little help with bathing/dressing/bathroom     Functional Status Assessment   Patient has had a recent decline in their functional status and demonstrates the ability to make significant improvements in function in a reasonable and predictable amount of time.     Equipment Recommendations   BSC/3in1     Recommendations for Other Services         Precautions/Restrictions   Precautions Precautions: Fall;Knee Recall of Precautions/Restrictions: Intact Restrictions Weight Bearing Restrictions Per Provider Order: Yes RLE Weight Bearing Per Provider Order: Weight bearing as tolerated     Mobility Bed Mobility Overal bed mobility: Modified Independent                  Transfers Overall transfer level: Needs assistance Equipment used: Rolling walker (2 wheels) Transfers: Sit to/from Stand Sit to Stand: Min assist            General transfer comment: improves to CGA from [facility] height      Balance Overall balance assessment: Needs assistance Sitting-balance support: Feet supported, No upper extremity supported Sitting balance-Leahy Scale: Good     Standing balance support: No upper extremity supported, During functional activity Standing balance-Leahy Scale: Fair                             ADL either performed or assessed with clinical judgement   ADL Overall ADL's : Needs assistance/impaired                                       General ADL Comments: CGA + RW for toilet t/f and pericare. SETUP for UB dressing in sitting. SBA standing grooming tasks, 1 posterior LOB with overhead reaching requiring MIN A to correct. MAX A don compression socks, MIN A don shoes in sitting      Pertinent Vitals/Pain Pain Assessment Pain Assessment: No/denies pain     Extremity/Trunk Assessment Upper Extremity Assessment Upper Extremity Assessment: Overall WFL for tasks assessed   Lower Extremity Assessment Lower Extremity Assessment: Overall WFL for tasks assessed       Communication Communication Communication: No apparent difficulties   Cognition Arousal: Alert Behavior During Therapy: WFL for tasks assessed/performed Cognition: No apparent impairments  Following commands: Intact       Cueing  General Comments   Cueing Techniques: Verbal cues;Tactile cues      Exercises     Shoulder Instructions      Home Living Family/patient expects to be discharged to:: Private residence Living Arrangements: Spouse/significant other Available Help at Discharge: Family;Available 24 hours/day Type of Home: House Home Access: Stairs to enter [employer] of Steps: 3 Entrance Stairs-Rails: Right;Left;Can reach both Home Layout: One level     Bathroom Shower/Tub: Chief Strategy Officer: Standard Bathroom  Accessibility: Yes   Home Equipment: Rollator (4 wheels);Tub bench   Additional Comments: 3 wheeled walker      Prior Functioning/Environment Prior Level of Function : Independent/Modified Independent             Mobility Comments: Uses 3WW at baseline in community      OT Problem List: Decreased range of motion;Impaired balance (sitting and/or standing)        OT Goals(Current goals can be found in the care plan section)   Acute Rehab OT Goals Patient Stated Goal: to go home OT Goal Formulation: With patient Time For Goal Achievement: 08/26/23 Potential to Achieve Goals: Good   AM-PAC OT "6 Clicks" Daily Activity     Outcome Measure Help from another person eating meals?: None Help from another person taking care of personal grooming?: A Little Help from another person toileting, which includes using toliet, bedpan, or urinal?: None Help from another person bathing (including washing, rinsing, drying)?: A Little Help from another person to put on and taking off regular upper body clothing?: None Help from another person to put on and taking off regular lower body clothing?: A Little 6 Click Score: 21   End of Session Equipment Utilized During Treatment: Rolling walker (2 wheels)  Activity Tolerance: Patient tolerated treatment well Patient left: with call bell/phone within reach;in chair (PT in room)  OT Visit Diagnosis: Other abnormalities of gait and mobility (R26.89);Muscle weakness (generalized) (M62.81)                Time: 1610-9604 OT Time Calculation (min): 36 min Charges:  OT General Charges $OT Visit: 1 Visit OT Evaluation $OT Eval Low Complexity: 1 Low OT Treatments $Self Care/Home Management : 23-37 mins  Gordan Latina, M.S. OTR/L  08/12/23, 10:01 AM  ascom [phone]

## 2023-08-12 NOTE — Discharge Summary (Signed)
 Physician Discharge Summary  Patient ID: Angela Suarez MRN: [id number] DOB/AGE: Sep 03, 1950 73 y.o.  Admit date: 08/11/2023 Discharge date: 08/12/2023  Admission Diagnoses:  Primary osteoarthritis of right knee [M17.11] Status post total knee replacement using cement, right [Z96.651]   Discharge Diagnoses: Patient Active Problem List   Diagnosis Date Noted   Status post total knee replacement using cement, right 08/11/2023    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    COPD (chronic obstructive pulmonary disease) (HCC)    mild-no inhalers   Coronary artery calcification seen on CAT scan    Depression    GERD (gastroesophageal reflux disease)    Gross hematuria    Headache    Used to have migraines, now sinus HA   History of kidney stones    Hypertension    IBS (irritable bowel syndrome)    Lumbar disc disease    Lupus anticoagulant disorder (HCC)    Lupus anticoagulant positive    Melanoma in situ (HCC)    Multiple sclerosis (HCC)    left leg weakness   Osteoarthritis of right knee    Senile purpura (HCC)    Smokers' cough (HCC)    Spinal stenosis, lumbar    Stroke (HCC)    06/2012, Mild left weakness, numbness to lip, cheek, left fingers   Thoracic aortic atherosclerosis (HCC)    Thoracic disc disease    Thrombocytopenia (HCC)    Vitamin B 12 deficiency    Vitamin D deficiency      Transfusion: None.   Consultants (if any):   Discharged Condition: Improved  Hospital Course: Angela Suarez is an 73 y.o. female who was admitted 08/11/2023 with a diagnosis of primary osteoarthritis of the right knee and went to the operating room on 08/11/2023 and underwent the above named procedures.    Surgeries: Procedure(s): ARTHROPLASTY, KNEE, TOTAL on 08/11/2023 Patient tolerated the surgery well. Taken to PACU where she was  stabilized and then transferred to the orthopedic floor.  Started on Eliquis 2.5mg  every 12 hrs. Heels elevated on bed with rolled towels. No evidence of DVT. Negative Homan. Physical therapy started on day #1 for gait training and transfer. OT started day #1 for ADL and assisted devices.  Patient's IV was removed on POD1.  Implants: Right TKA using all-cemented Zimmer Persona system with a #7 PCR femur, a(n) D-sized  tibial tray with an 11 mm medial congruent E-poly insert, and a 31 x 6.2 mm all-poly Vanguard 3-pegged domed patella.   She was given perioperative antibiotics:  Anti-infectives (From admission, onward)    Start     Dose/Rate Route Frequency Ordered Stop   08/11/23 1330  ceFAZolin  (ANCEF ) IVPB 2g/100 mL premix        2 g 200 mL/hr over 30 Minutes Intravenous Every 6 hours 08/11/23 1147 08/11/23 2108   08/11/23 0600  ceFAZolin  (ANCEF ) IVPB 2g/100 mL premix        2 g 200 mL/hr over 30 Minutes Intravenous On call to O.R. 08/10/23 2205 08/11/23 0800     .  She was given sequential compression devices, early ambulation, and Eliquis for DVT prophylaxis.  She benefited maximally from the hospital stay and there were no complications.    Recent vital signs:  Vitals:   08/11/23 2026 08/12/23 0351  BP: 121/72 124/66  Pulse: 74 76  Resp: 16 16  Temp: (!) 97.5 F (36.4 C) (!) 97.5 F (36.4 C)  SpO2: 95% 93%    Recent laboratory studies:  Lab Results  Component Value Date   HGB 13.6 08/03/2023   HGB 13.3 05/27/2021   HGB 11.9 (L) 12/04/2020   Lab Results  Component Value Date   WBC 6.3 08/03/2023   PLT 140 (L) 08/03/2023   No results found for: "INR" Lab Results  Component Value Date   NA 142 08/03/2023   K 4.0 08/03/2023   CL 107 08/03/2023   CO2 28 08/03/2023   BUN 24 (H) 08/03/2023   CREATININE 0.87 08/03/2023   GLUCOSE 81 08/03/2023    Discharge Medications:   Allergies as of 08/12/2023       Reactions   Codeine Nausea And Vomiting   Duloxetine     Disturbed dreams    *Cymbalta   Hydrocodone     Ankle swelling   Oxycodone Nausea And Vomiting   Prevacid [lansoprazole] Diarrhea   Prilosec [omeprazole] Diarrhea   Pyridium [phenazopyridine Hcl]    Unknown Reaction    Flagyl [metronidazole] Rash   Lamisil [terbinafine] Rash        Medication List     STOP taking these medications    aspirin  EC 81 MG tablet   traMADol 50 MG tablet Commonly known as: ULTRAM       TAKE these medications    acetaminophen  650 MG CR tablet Commonly known as: TYLENOL  Take 650 mg by mouth every 8 (eight) hours as needed for pain.   amLODipine 5 MG tablet Commonly known as: NORVASC Take 5 mg by mouth every morning.   apixaban 2.5 MG Tabs tablet Commonly known as: ELIQUIS Take 1 tablet (2.5 mg total) by mouth 2 (two) times daily.   atorvastatin 20 MG tablet Commonly known as: LIPITOR Take 20 mg by mouth every evening.   Blue-Emu Hemp 10 % cream Generic drug: trolamine salicylate Apply 1 application  topically at bedtime.   cholecalciferol 25 MCG (1000 UNIT) tablet Commonly known as: VITAMIN D3 Take 1,000 Units by mouth daily.   clonazePAM 1 MG tablet Commonly known as: KLONOPIN Take 3 mg by mouth at bedtime.   cyanocobalamin 1000 MCG tablet Commonly known as: VITAMIN B12 Take 1,000 mcg by mouth daily.   HYDROmorphone  2 MG tablet Commonly known as: DILAUDID  Take 0.5-1 tablets (1-2 mg total) by mouth every 4 (four) hours as needed for severe pain (pain score 7-10) or moderate pain (pain score 4-6).   ondansetron  4 MG tablet Commonly known as: ZOFRAN  Take 1 tablet (4 mg total) by mouth every 6 (six) hours as needed for nausea.   pantoprazole 20 MG tablet Commonly known as: PROTONIX Take 20 mg by mouth every morning.   pregabalin 150 MG capsule Commonly known as: LYRICA Take 150 mg by mouth at bedtime.   pregabalin 75 MG capsule Commonly known as: LYRICA Take 75 mg by mouth daily with lunch.   sertraline 100 MG  tablet Commonly known as: ZOLOFT Take 100 mg by mouth in the morning.   triamcinolone 55 MCG/ACT Aero nasal inhaler Commonly known as: NASACORT Place 2 sprays into the nose daily as needed (allergies).               Durable Medical Equipment  (From admission, onward)           Start     Ordered   08/11/23 1148  DME 3 n 1  Once        08/11/23 1147   08/11/23 1148  DME Walker rolling  Once       Question Answer Comment  Walker: With 5 Inch Wheels   Patient needs a walker to treat with the following condition Status post total knee replacement using cement, right      08/11/23 1147            Diagnostic Studies: DG Knee Right Port Result Date: 08/11/2023 CLINICAL DATA:  Total knee replacement. EXAM: PORTABLE RIGHT KNEE - 1-2 VIEW COMPARISON:  None Available. FINDINGS: Right knee arthroplasty in expected alignment. No periprosthetic lucency or fracture. There has been patellar resurfacing. Recent postsurgical change includes air and edema in the soft tissues and joint space. Anterior skin staples in place. IMPRESSION: Right knee arthroplasty without immediate postoperative complication. Electronically Signed   By: Chadwick Colonel M.D.   On: 08/11/2023 10:30   Disposition: Plan for discharge home today pending progress with PT.    Follow-up Information     Rojelio Clement, PA-C Follow up in 14 day(s).   Specialty: Physician Assistant Why: Nigel Bart information: [address] [place] [address] [phone]                Signed: Juanell Nora PA-C 08/12/2023, 7:06 AM

## 2023-09-15 ENCOUNTER — Encounter: Payer: Self-pay | Admitting: Dermatology

## 2023-09-15 ENCOUNTER — Ambulatory Visit: Payer: Medicare HMO | Admitting: Dermatology

## 2023-09-15 DIAGNOSIS — D692 Other nonthrombocytopenic purpura: Secondary | ICD-10-CM

## 2023-09-15 DIAGNOSIS — L853 Xerosis cutis: Secondary | ICD-10-CM

## 2023-09-15 DIAGNOSIS — L814 Other melanin hyperpigmentation: Secondary | ICD-10-CM

## 2023-09-15 DIAGNOSIS — I781 Nevus, non-neoplastic: Secondary | ICD-10-CM

## 2023-09-15 DIAGNOSIS — L821 Other seborrheic keratosis: Secondary | ICD-10-CM

## 2023-09-15 DIAGNOSIS — D229 Melanocytic nevi, unspecified: Secondary | ICD-10-CM

## 2023-09-15 DIAGNOSIS — W908XXA Exposure to other nonionizing radiation, initial encounter: Secondary | ICD-10-CM | POA: Diagnosis not present

## 2023-09-15 DIAGNOSIS — Z1283 Encounter for screening for malignant neoplasm of skin: Secondary | ICD-10-CM

## 2023-09-15 DIAGNOSIS — D1801 Hemangioma of skin and subcutaneous tissue: Secondary | ICD-10-CM

## 2023-09-15 DIAGNOSIS — D225 Melanocytic nevi of trunk: Secondary | ICD-10-CM

## 2023-09-15 DIAGNOSIS — L905 Scar conditions and fibrosis of skin: Secondary | ICD-10-CM

## 2023-09-15 DIAGNOSIS — L578 Other skin changes due to chronic exposure to nonionizing radiation: Secondary | ICD-10-CM

## 2023-09-15 NOTE — Progress Notes (Signed)
   New Patient Visit   Subjective  Angela Suarez is a 73 y.o. female who presents for the following: Skin Cancer Screening and Full Body Skin Exam  The patient presents for Total-Body Skin Exam (TBSE) for skin cancer screening and mole check. The patient has spots, moles and lesions to be evaluated, some may be new or changing. She has a history of melanoma in situ at the left chest, treated by Dr Tyrone Gallop ~ 10 years ago.     The following portions of the chart were reviewed this encounter and updated as appropriate: medications, allergies, medical history  Review of Systems:  No other skin or systemic complaints except as noted in HPI or Assessment and Plan.  Objective  Well appearing patient in no apparent distress; mood and affect are within normal limits.  A full examination was performed including scalp, head, eyes, ears, nose, lips, neck, chest, axillae, abdomen, back, buttocks, bilateral upper extremities, bilateral lower extremities, hands, feet, fingers, toes, fingernails, and toenails. All findings within normal limits unless otherwise noted below.   Relevant physical exam findings are noted in the Assessment and Plan.    Assessment & Plan   SKIN CANCER SCREENING PERFORMED TODAY.  ACTINIC DAMAGE - Chronic condition, secondary to cumulative UV/sun exposure - diffuse scaly erythematous macules with underlying dyspigmentation - Recommend daily broad spectrum sunscreen SPF 30+ to sun-exposed areas, reapply every 2 hours as needed.  - Staying in the shade or wearing long sleeves, sun glasses (UVA+UVB protection) and wide brim hats (4-inch brim around the entire circumference of the hat) are also recommended for sun protection.  - Call for new or changing lesions.  LENTIGINES, SEBORRHEIC KERATOSES, HEMANGIOMAS - Benign normal skin lesions - Benign-appearing - Call for any changes  MELANOCYTIC NEVI - Tan-brown and/or pink-flesh-colored symmetric macules and papules - R lower  back 5 x 2 mm med dark brown macule - Benign appearing on exam today - Observation - Call clinic for new or changing moles - Recommend daily use of broad spectrum spf 30+ sunscreen to sun-exposed areas.   Xerosis - diffuse xerotic patches - recommend gentle, hydrating skin care - gentle skin care handout given  Purpura - Chronic; persistent and recurrent.  Treatable, but not curable. - Violaceous macules and patches - Benign - Related to trauma, age, sun damage and/or use of blood thinners, chronic use of topical and/or oral steroids - Observe - Can use OTC arnica containing moisturizer such as Dermend Bruise Formula if desired - Call for worsening or other concerns  TELANGIECTASIA Exam: dilated blood vessels at face  Treatment Plan: Benign appearing on exam Call for changes  SCAR Exam: Dyspigmented linear patch at right knee healing from recent knee replacement surgery.  Benign-appearing.  Observation.  Call clinic for new or changing lesions. Recommend daily broad spectrum sunscreen SPF 30+, reapply every 2 hours as needed. Treatment: Recommend Serica moisturizing scar formula cream every night or [facility] brand or Mederma silicone scar sheet every night for the first year after a scar appears to help with scar remodeling if desired. Scars remodel on their own for a full year and will gradually improve in appearance over time.     Return in about 1 year (around 09/14/2024) for TBSE, Hx melanoma IS.  IBernardine Bridegroom, CMA, am acting as scribe for Artemio Larry, MD .   Documentation: I have reviewed the above documentation for accuracy and completeness, and I agree with the above.  Artemio Larry, MD

## 2023-09-15 NOTE — Patient Instructions (Addendum)
 Seborrheic Keratosis  What causes seborrheic keratoses? Seborrheic keratoses are harmless, common skin growths that first appear during adult life.  As time goes by, more growths appear.  Some people may develop a large number of them.  Seborrheic keratoses appear on both covered and uncovered body parts.  They are not caused by sunlight.  The tendency to develop seborrheic keratoses can be inherited.  They vary in color from skin-colored to gray, brown, or even black.  They can be either smooth or have a rough, warty surface.   Seborrheic keratoses are superficial and look as if they were stuck on the skin.  Under the microscope this type of keratosis looks like layers upon layers of skin.  That is why at times the top layer may seem to fall off, but the rest of the growth remains and re-grows.    Treatment Seborrheic keratoses do not need to be treated, but can easily be removed in the office.  Seborrheic keratoses often cause symptoms when they rub on clothing or jewelry.  Lesions can be in the way of shaving.  If they become inflamed, they can cause itching, soreness, or burning.  Removal of a seborrheic keratosis can be accomplished by freezing, burning, or surgery. If any spot bleeds, scabs, or grows rapidly, please return to have it checked, as these can be an indication of a skin cancer.   Gentle Skin Care Guide  1. Bathe no more than once a day.  2. Avoid bathing in hot water   3. Use a mild soap like Dove, Vanicream, Cetaphil, CeraVe. Can use Lever 2000 or Cetaphil antibacterial soap  4. Use soap only where you need it. On most days, use it under your arms, between your legs, and on your feet. Let the water  rinse other areas unless visibly dirty.  5. When you get out of the bath/shower, use a towel to gently blot your skin dry, don't rub it.  6. While your skin is still a little damp, apply a moisturizing cream such as Vanicream, CeraVe, Cetaphil, Eucerin, Sarna lotion or plain  Vaseline Jelly. For hands apply Neutrogena [place] Hand Cream or Excipial Hand Cream.  7. Reapply moisturizer any time you start to itch or feel dry.  8. Sometimes using free and clear laundry detergents can be helpful. Fabric softener sheets should be avoided. Downy Free & Gentle liquid, or any liquid fabric softener that is free of dyes and perfumes, it acceptable to use  9. If your doctor has given you prescription creams you may apply moisturizers over them      Melanoma ABCDEs  Melanoma is the most dangerous type of skin cancer, and is the leading cause of death from skin disease.  You are more likely to develop melanoma if you: Have light-colored skin, light-colored eyes, or red or blond hair Spend a lot of time in the sun Tan regularly, either outdoors or in a tanning bed Have had blistering sunburns, especially during childhood Have a close family member who has had a melanoma Have atypical moles or large birthmarks  Early detection of melanoma is key since treatment is typically straightforward and cure rates are extremely high if we catch it early.   The first sign of melanoma is often a change in a mole or a new dark spot.  The ABCDE system is a way of remembering the signs of melanoma.  A for asymmetry:  The two halves do not match. B for border:  The edges of the growth  are irregular. C for color:  A mixture of colors are present instead of an even brown color. D for diameter:  Melanomas are usually (but not always) greater than 6mm - the size of a pencil eraser. E for evolution:  The spot keeps changing in size, shape, and color.  Please check your skin once per month between visits. You can use a small mirror in front and a large mirror behind you to keep an eye on the back side or your body.   If you see any new or changing lesions before your next follow-up, please call to schedule a visit.  Please continue daily skin protection including broad spectrum sunscreen  SPF 30+ to sun-exposed areas, reapplying every 2 hours as needed when you're outdoors.   Staying in the shade or wearing long sleeves, sun glasses (UVA+UVB protection) and wide brim hats (4-inch brim around the entire circumference of the hat) are also recommended for sun protection.      Due to recent changes in healthcare laws, you may see results of your pathology and/or laboratory studies on MyChart before the doctors have had a chance to review them. We understand that in some cases there may be results that are confusing or concerning to you. Please understand that not all results are received at the same time and often the doctors may need to interpret multiple results in order to provide you with the best plan of care or course of treatment. Therefore, we ask that you please give us  2 business days to thoroughly review all your results before contacting the office for clarification. Should we see a critical lab result, you will be contacted sooner.   If You Need Anything After Your Visit  If you have any questions or concerns for your doctor, please call our main line at [phone] and press option 4 to reach your doctor's medical assistant. If no one answers, please leave a voicemail as directed and we will return your call as soon as possible. Messages left after 4 pm will be answered the following business day.   You may also send us  a message via MyChart. We typically respond to MyChart messages within 1-2 business days.  For prescription refills, please ask your pharmacy to contact our office. Our fax number is [phone].  If you have an urgent issue when the clinic is closed that cannot wait until the next business day, you can page your doctor at the number below.    Please note that while we do our best to be available for urgent issues outside of office hours, we are not available 24/7.   If you have an urgent issue and are unable to reach us , you may choose to seek medical  care at your doctor's office, retail clinic, urgent care center, or emergency room.  If you have a medical emergency, please immediately call 911 or go to the emergency department.  Pager Numbers  - Dr. Bary Likes: [phone]  - Dr. Annette Barters: [phone]  - Dr. Felipe Horton: [phone]   In the event of inclement weather, please call our main line at [phone] for an update on the status of any delays or closures.  Dermatology Medication Tips: Please keep the boxes that topical medications come in in order to help keep track of the instructions about where and how to use these. Pharmacies typically print the medication instructions only on the boxes and not directly on the medication tubes.   If your medication is too expensive, please contact  our office at [phone] option 4 or send us  a message through MyChart.   We are unable to tell what your co-pay for medications will be in advance as this is different depending on your insurance coverage. However, we may be able to find a substitute medication at lower cost or fill out paperwork to get insurance to cover a needed medication.   If a prior authorization is required to get your medication covered by your insurance company, please allow us  1-2 business days to complete this process.  Drug prices often vary depending on where the prescription is filled and some pharmacies may offer cheaper prices.  The website www.goodrx.com contains coupons for medications through different pharmacies. The prices here do not account for what the cost may be with help from insurance (it may be cheaper with your insurance), but the website can give you the price if you did not use any insurance.  - You can print the associated coupon and take it with your prescription to the pharmacy.  - You may also stop by our office during regular business hours and pick up a GoodRx coupon card.  - If you need your prescription sent electronically to a different  pharmacy, notify our office through [facility] or by phone at [phone] option 4.     Si Usted Necesita Algo Despus de Su Visita  Tambin puede enviarnos un mensaje a travs de Clinical cytogeneticist. Por lo general respondemos a los mensajes de MyChart en el transcurso de 1 a 2 das hbiles.  Para renovar recetas, por favor pida a su farmacia que se ponga en contacto con nuestra oficina. Franz Jacks de fax es [place] [phone].  Si tiene un asunto urgente cuando la clnica est cerrada y que no puede esperar hasta el siguiente da hbil, puede llamar/localizar a su doctor(a) al nmero que aparece a continuacin.   Por favor, tenga en cuenta que aunque hacemos todo lo posible para estar disponibles para asuntos urgentes fuera del horario de [place], no estamos disponibles las 24 horas del da, los 7 [address]  [address] de la [place].   Si tiene un problema urgente y no puede comunicarse con nosotros, puede optar por buscar atencin mdica  en el consultorio de su doctor(a), en una clnica privada, en un centro de atencin urgente o en una sala de emergencias.  Si tiene Engineer, drilling, por favor llame inmediatamente al 911 o vaya a la sala de emergencias.  Nmeros de bper  - Dr. Bary Likes: [phone]  - Dra. Annette Barters: [phone]  - Dr. Felipe Horton: [phone]   En caso de inclemencias del tiempo, por favor llame a Lajuan Pila principal al [phone] para una actualizacin sobre el [place] de cualquier retraso o cierre.  Consejos para la medicacin en dermatologa: Por favor, guarde las cajas en las que vienen los medicamentos de uso tpico para ayudarle a seguir las instrucciones sobre dnde y cmo usarlos. Las farmacias generalmente imprimen las instrucciones del medicamento slo en las cajas y no directamente en los tubos del [place].   Si su medicamento es muy caro, por favor, pngase en contacto con Bettyjane Brunet llamando al [phone] y presione la opcin 4 o envenos un mensaje a  travs de Clinical cytogeneticist.   No podemos decirle cul ser su copago por los medicamentos por adelantado ya que esto es diferente dependiendo de la cobertura de su seguro. Sin embargo, es posible que podamos encontrar un medicamento sustituto a Audiological scientist un formulario para que el seguro [place]  medicamento que se considera necesario.   Si se requiere una autorizacin previa para que su compaa de seguros [place] su medicamento, por favor permtanos de 1 a 2 das hbiles para completar este proceso.  Los precios de los medicamentos varan con frecuencia dependiendo del Environmental consultant de dnde se surte la receta y alguna farmacias pueden ofrecer precios ms baratos.  El sitio web www.goodrx.com tiene cupones para medicamentos de Health and safety inspector. Los precios aqu no tienen en cuenta lo que podra costar con la ayuda del seguro (puede ser ms barato con su seguro), pero el sitio web puede darle el precio si no utiliz Tourist information centre manager.  - Puede imprimir el cupn correspondiente y llevarlo con su receta a la farmacia.  - Tambin puede pasar por nuestra oficina durante el horario de atencin regular y Education officer, museum una tarjeta de cupones de GoodRx.  - Si necesita que su receta se enve electrnicamente a una farmacia diferente, informe a nuestra oficina a travs de MyChart de [place] o por telfono llamando al [phone] y presione la opcin 4.

## 2023-10-06 ENCOUNTER — Encounter: Payer: Self-pay | Admitting: Student in an Organized Health Care Education/Training Program

## 2023-10-06 ENCOUNTER — Ambulatory Visit
Attending: Student in an Organized Health Care Education/Training Program | Admitting: Student in an Organized Health Care Education/Training Program

## 2023-10-06 VITALS — BP 126/76 | HR 75 | Temp 97.9°F | Resp 15 | Ht 61.5 in | Wt 130.0 lb

## 2023-10-06 DIAGNOSIS — M5136 Other intervertebral disc degeneration, lumbar region with discogenic back pain only: Secondary | ICD-10-CM | POA: Diagnosis present

## 2023-10-06 DIAGNOSIS — M48062 Spinal stenosis, lumbar region with neurogenic claudication: Secondary | ICD-10-CM | POA: Insufficient documentation

## 2023-10-06 DIAGNOSIS — M47816 Spondylosis without myelopathy or radiculopathy, lumbar region: Secondary | ICD-10-CM | POA: Diagnosis not present

## 2023-10-06 DIAGNOSIS — M48061 Spinal stenosis, lumbar region without neurogenic claudication: Secondary | ICD-10-CM | POA: Diagnosis present

## 2023-10-06 NOTE — Progress Notes (Signed)
 Safety precautions to be maintained throughout the outpatient stay will include: orient to surroundings, keep bed in low position, maintain call bell within reach at all times, provide assistance with transfer out of bed and ambulation.

## 2023-10-06 NOTE — Progress Notes (Signed)
 PROVIDER NOTE: Interpretation of information contained herein should be left to medically-trained personnel. Specific patient instructions are provided elsewhere under Patient Instructions section of medical record. This document was created in part using AI and STT-dictation technology, any transcriptional errors that may result from this process are unintentional.  Patient: Angela Suarez  Service: E/M Encounter  Provider: Wallie Sherry, MD  DOB: 12/17/1950  Delivery: Face-to-face  Specialty: Interventional Pain Management  MRN: [id number]  Setting: Ambulatory outpatient facility  Specialty designation: 09  Type: New Patient  Location: Outpatient office facility  PCP: Fernande Ophelia JINNY DOUGLAS, MD  DOS: 10/06/2023    Referring Prov.: Kip Lynwood Double, PA*   Primary Reason(s) for Visit: Encounter for initial evaluation of one or more chronic problems (new to examiner) potentially causing chronic pain, and posing a threat to normal musculoskeletal function. (Level of risk: High) CC: Back Pain  HPI  Angela Suarez is a 73 y.o. year old, female patient, who comes for the first time to our practice referred by Kip Lynwood Double, PA* for our initial evaluation of her chronic pain. She has Status post total knee replacement using cement, right; Degeneration of intervertebral disc of lumbar region with discogenic back pain; Spinal stenosis, lumbar region, with neurogenic claudication; and Foraminal stenosis of lumbar region on their problem list. Today she comes in for evaluation of her Back Pain  Pain Assessment: Location: Lower Back Radiating: to suprascapular area - right side worse; also to right hip Onset: More than a month ago Duration: Chronic pain Quality: Aching, Sharp Severity: 4 /10 (subjective, self-reported pain score)  Effect on ADL: limits adls, limits mobility; I continue to do what I need to but I'm just in pain while I'm doing it Timing: Constant Modifying factors:   BP: 126/76  HR:  75  Onset and Duration: Gradual Cause of pain: Unknown Severity: Getting worse, NAS-11 at its worse: 8/10, NAS-11 at its best: 3/10, NAS-11 now: 8/10, and NAS-11 on the average: 6/10 Timing: Not influenced by the time of the day Aggravating Factors: Bending, Bowel movements, Climbing, Kneeling, Lifiting, Motion, Prolonged standing, Squatting, Walking, Walking uphill, Walking downhill, and Working Alleviating Factors: Cold packs, Lying down, Sitting, Using a brace, and Chiropractic manipulations Associated Problems: Constipation, Fatigue, Inability to control bowel, Sadness, and Weakness Quality of Pain: Aching, Annoying, Deep, Disabling, Distressing, Exhausting, Nagging, Shooting, Throbbing, and Tiring Previous Examinations or Tests: CT scan, Orthopedic evaluation, and Chiropractic evaluation Previous Treatments: Chiropractic manipulations and Pool exercises  Angela Suarez is being evaluated for possible interventional pain management therapies for the treatment of her chronic pain.  Discussed the use of AI scribe software for clinical note transcription with the patient, who gave verbal consent to proceed.  History of Present Illness   Angela Suarez is a 73 year old female with chronic pain and multiple sclerosis who presents with worsening back pain.  She has experienced chronic back pain that has significantly worsened over the past two to three years. The pain is primarily located in the lower back, radiating to the hip and occasionally causing abdominal pain reminiscent of menstrual cramps. The severity of the pain affects her posture and mobility, necessitating the use of an assistive device when walking outside the house.  Her medical history includes multiple sclerosis and recent knee replacement surgery eight weeks ago, which is healing well. 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 Her past medical history includes  significant  degenerative disc disease and severe foraminal stenosis. She has not yet engaged in physical therapy for her back pain.  Her current medication regimen includes Lyrica , with a dose of 150 mg at bedtime and 75 mg at lunch. She previously tried gabapentin without relief. She was on 325 mg of aspirin  twice daily prior to her knee surgery to prevent blood clots, but has since switched to a baby aspirin  regimen. She occasionally uses Tylenol  for muscle back pain and Excedrin for headaches, but has not taken Dilaudid  for two weeks, which she used post-surgery for knee pain.  No current use of NSAIDs like ibuprofen or Advil due to previous issues with petechiae. She prefers physical therapy at [facility], where she has had positive experiences in the past.       Meds   Current Outpatient Medications:    amLODipine  (NORVASC ) 5 MG tablet, Take 5 mg by mouth every morning., Disp: , Rfl:    atorvastatin  (LIPITOR) 20 MG tablet, Take 20 mg by mouth every evening., Disp: , Rfl:    cholecalciferol  (VITAMIN D3) 25 MCG (1000 UNIT) tablet, Take 1,000 Units by mouth daily., Disp: , Rfl:    clonazePAM  (KLONOPIN ) 1 MG tablet, Take 3 mg by mouth at bedtime., Disp: , Rfl:    HYDROmorphone  (DILAUDID ) 2 MG tablet, Take 0.5-1 tablets (1-2 mg total) by mouth every 4 (four) hours as needed for severe pain (pain score 7-10) or moderate pain (pain score 4-6)., Disp: 30 tablet, Rfl: 0   pantoprazole  (PROTONIX ) 20 MG tablet, Take 20 mg by mouth every morning., Disp: , Rfl:    pregabalin  (LYRICA ) 150 MG capsule, Take 150 mg by mouth at bedtime., Disp: , Rfl:    pregabalin  (LYRICA ) 75 MG capsule, Take 75 mg by mouth daily with lunch., Disp: , Rfl:    sertraline  (ZOLOFT ) 100 MG tablet, Take 100 mg by mouth in the morning., Disp: , Rfl:    triamcinolone  (NASACORT ) 55 MCG/ACT AERO nasal inhaler, Place 2 sprays into the nose daily as needed (allergies)., Disp: , Rfl:    trolamine salicylate (BLUE-EMU  HEMP) 10 % cream, Apply 1 application  topically at bedtime., Disp: , Rfl:    vitamin B-12 (CYANOCOBALAMIN ) 1000 MCG tablet, Take 1,000 mcg by mouth daily., Disp: , Rfl:   Imaging Review  Cervical Imaging: Cervical MR wo contrast: No results found for this or any previous visit.  Cervical MR wo contrast: No results found for this or any previous visit.  Cervical MR w/wo contrast: Results for orders placed in visit on 07/29/01  MR Cervical Spine W Wo Contrast  Narrative FINDINGS CLINICAL DATA:    PREVIOUS ANTERIOR CERVICAL FUSION.  RIGHT FACIAL NUMBNESS.  MYELOPATHY.  PRIOR CERVICAL DISKECTOMIES AND FUSIONS. MRI OF THE CERVICAL SPINE WITH AND WITHOUT CONTRAST ROUTINE HIGH FIELD STRENGTH IMAGES WERE ACQUIRED PRIOR TO AND FOLLOWING IV INJECTION OF 20cc OF OMNISCAN.  COMPARISON MADE TO PREOPERATIVE MR SCAN FROM [facility], [place] . FINDINGS NORMAL APPEARING CRANIOVERTEBRAL JUNCTION.  PROBABLY MILD DEGENERATIVE  CHANGES INVOLVING THE C1-2 ARTICULATION.  C2-3 LEVEL APPEARS UNREMARKABLE. AT C3-4, THERE IS MILD VERTEBRAL BODY OSTEOPHYTIC FORMATION AND ANNULAR BULGING. AT C4-5, THERE IS DISC SPACE NARROWING AND VERTEBRAL BODY OSTEOPHYTIC FORMATION, BOTH OF WHICH APPEAR TO HAVE INCREASED WHEN COMPARED TO INTRAOPERATIVE LATERAL CERVICAL SPINE VIEW ON 11/21/1997. THERE IS A FOCUS OF MARROW SIGNAL ALTERATION INVOLVING THE POSTERIOR/INFERIOR ASPECT OF C4 AND THE APPEARANCE OF SCHMORL'S NODE.  CIRCUMFERENTIAL ANNULAR BULGING AND BROAD BASED POSTERIOR DISC  PROTRUSION ARE NOTED AT C4-5.  IN THIS PATIENT WITH REPORTED RIGHT UPPER EXTREMITY SYMPTOMS,, NOTE THAT THERE IS A DISC HERNIATION AND MORE FOCAL PROMINENT DISC HERNIATION POSTEROLATERALLY ON THE RIGHT NOTED JUST MEDIAL TO AND WITHIN THE FORAMEN, AND THERE IS HIGH LIKELIHOOD THAT THERE IS MASS EFFECT ON THE RIGHT C5 NERVE ROOT.  ALSO VERTEBRAL OSTEOPHYTIC FORMATION AND UNCINATE PROCESS HYPERTROPHY ARE PRESENT  BILATERALLY, MORE SO ON THE RIGHT.  LEFT FORAMEN IS ALSO NARROWED AND THERE IS A SMALLER PROTRUSION AT THAT SITE.  THERE IS MODERATE CENTRAL CANAL STENOSIS AT C4-5 WITH ENCROACHMENT ON THE CORD BUT NO FRANK CORD COMPRESSION.  SUBARACHNOID SPACE VENTRAL AND DORSAL TO THE CORD IS EFFACED.  THE SPONDYLOTIC CHANGES AT C4-5 HAVE INCREASED SINCE THE 06/30/1997 SCAN. AT C5-6, INTERBODY FUSION SITE APPEARS UNREMARKABLE.  THERE IS ANTERIOR PLATE AND SCREW FIXATION HARDWARE WHICH ONLY CREATES A LOCAL ARTIFACT AND DOES NOT PRECLUDE IMAGE INTERPRETATION. AT C6-7, THERE IS MILD TO MODERATE CENTRAL CANAL STENOSIS.  THERE IS A BROAD BASED DISC PROTRUSION. THIS IS SMALL TO MODERATE IN SIZE AND IS LOCATED IN THE MIDLINE AND PARACENTRALLY ON THE RIGHT, AND TO A LESSER DEGREE ON THE LEFT.  THIS REPRESENTS A NEW FINDING SINCE THE 06/30/1997 STUDY. THERE IS FORAMINAL STENOSIS ON THE RIGHT. AT C7-T1, THE LEVEL IS UNREMARKABLE.  MILD ECCENTRIC DISC BULGE PARACENTRALLY ON THE RIGHT AT T1-2. CORD ABNORMALITIES ARE APPRECIATED WITH A FOCUS OF ABNORMAL SIGNAL INTENSITY WITHIN THE LEFT ANTERIOR ASPECT OF THE CORD AT THE C3-4 LEVEL.  THIS FOCUS MEASURES APPROXIMATELY 5 X 7mm.  SIMILAR APPEARING FOCUS OF SIGNAL ALTERATION IS NOTED WITHIN THE POSTERIOR ASPECT OF THE CORD AT THE C5-6 LEVEL JUST TO THE LEFT OF THE MIDLINE.  CORD LESION AT C3-4 CONTRAST ENHANCES. I APPRECIATE NO PONTINE OR MEDULLARY LESIONS. IMPRESSION 1.  CENTRAL CANAL STENOSIS AT C4-5 AND C6-7 WITH ENCROACHMENT ON THE CORD.  DISC PROTRUSIONS AT C4- 5 AND C5-6 CONTRIBUTING TO CENTRAL CANAL NARROWING AND FORAMINAL STENOSIS, PARTICULARLY ON THE RIGHT AT C4-5 WITH LIKELY MASS EFFECT ON THE RIGHT C5 NERVE ROOT.  ENCROACHMENT ON THE RIGHT C7 NERVE ROOT AND THE FORAMEN, BUT THIS APPEARS TO BE LESS IMPRESSIVE IN THE FINDINGS AT C4-5. 2.  THERE ARE A COUPLE OF FOCI OF THE CORD SIGNAL ALTERATION.  ONE AT THE INFERIOR C3 LEVEL AND THE OTHER AT C5-6, THE ETIOLOGY OF WHICH  IS UNCERTAIN.  THE LESION AT THE INFERIOR C3 LEVEL CONTRAST ENHANCES.  MULTIPLE FOCI OF DEMYELINATION WOULD BE A LEADING CONSIDERATION.  MS IS A DEFINITE CONSIDERATION.  ALSO CONSIDER TRANSVERSE MYELITIS AND SMALL FOCI OF CORD INFARCTIONS.  FOR EXAMPLE, DUE TO ENDARTERITIS.  INCIDENTALLY, PROTON DENSITY WEIGHTED SAGITTAL IMAGES IS POSSIBLE THERE IS A VERY SUBTLE LESION INVOLVING THE ANTERIOR ASPECT OF THE CORD AT C2. MR ANGIOGRAPHY OF THE BRAIN WITHOUT CONTRAST 3-D TIME OF FLIGHT IMAGES WERE ACQUIRED ACCORDING TO STANDARD PROTOCOL.  THE VERTEBRAL, BASILAR AND INTERNAL CAROTID ARTERIES ARE PATENT.  INTRACRANIAL BRANCHES ARE PATENT AS WELL.  INCIDENTALLY, WE OBTAINED DIFFUSION-WEIGHTED IMAGES WHICH REVEAL NO EVIDENCE OF ACUTE/SUBACUTE INFARCTION.  SOME OF THE INTRACRANIAL BRANCHES, MAINLY SOME OF THE LEFT MCA BRANCHES HAVE A BEADED APPEARANCE WITH FOCAL AREAS OF NARROWING AND DILATATION.  THERE MAY BE A SMALL ANEURYSM INVOLVING A PROXIMAL LEFT MCA BRANCH IN THE [place] REGIONS ABOUT 1cm DISTAL TO THE MCA TRIFURCATION.  ALSO, THERE IS A BEADED IRREGULAR APPEARANCE OF THE POSTERIOR CEREBRAL ARTERIES, LEFT GREATER THAN RIGHT INVOLVING T2/T3 REGIONS. IMPRESSION 1.  MRA FINDINGS ARE  QUITE WORRISOME FOR AVASCULITIS (ARTERIOLITIS).  IT IS POSSIBLE THAT THE CERVICAL SPINAL CORD LESIONS ARE A RESULT OF AVASCULITIS AS WELL.  THE PATIENT HAD AN MRI OF THE BRAIN WITH AND WITHOUT IV CONTRAST AT [facility], [place]  ON 06/17/2001.  THAT SCAN WAS ABNORMAL.  I DO NOT HAVE THE REPORT FOR THIS SCAN AT THIS TIME. 2.  FORMAL CONVENTIONAL CEREBRAL ARTERIOGRAPHY MAY BE HELPFUL FOR FURTHER ANATOMIC ASSESSMENT. MR ANGIOGRAPHY OF THE NECK WITH CONTRAST MEDIA 3-D TIME OF FLIGHT IMAGES WERE ACQUIRED DURING IV INFUSION OF 20cc OF OMNISCAN.  VISUALIZED PORTION OF THE TRANSVERSE AORTIC ARCH APPEARS UNREMARKABLE.  BRACHIOCEPHALIC VESSELS ARE PATENT.  VERTEBRAL ARTERIES ARE APPROXIMATELY  EQUAL IN SIZE.  COMMON CAROTID ARTERIES AND CAROTID BIFURCATIONS APPEAR UNREMARKABLE.  NO CAROTID BIFURCATIONAL STENOSIS.  ON THE IMAGES OF THE BRACHIOCEPHALIC VESSELS, I WAS INITIALLY CONCERNED THERE MAY BE ABNORMAL IRREGULARITY OF THE DISTAL ASPECT OF THE RIGHT ICA INCLUDING ITS PETROUS AND PRECAVERNOUS SEGMENTS.  HOWEVER, THIS REGION APPEARS UNREMARKABLE ON THE CONED DOWN MIP IMAGES FOR THE BRAIN MRA. IMPRESSION UNREMARKABLE MRA OF THE NECK.  POSSIBLE MILD TO MODERATE STENOSIS OF PROXIMAL ASPECT OF THE RIGHT VERTEBRAL.  SEE COMMENTS ABOVE.  Provider: Charla Roby  CT Cervical Spine Wo Contrast  Narrative CLINICAL DATA:  Fall.  Head/neck trauma.  EXAM: CT HEAD WITHOUT CONTRAST  CT CERVICAL SPINE WITHOUT CONTRAST  TECHNIQUE: Multidetector CT imaging of the head and cervical spine was performed following the standard protocol without intravenous contrast. Multiplanar CT image reconstructions of the cervical spine were also generated.  COMPARISON:  None.  FINDINGS: CT HEAD FINDINGS  Brain: No evidence of acute infarction, hemorrhage, hydrocephalus, extra-axial collection or mass lesion/mass effect.  Patchy periventricular white matter hypoattenuation is noted consistent with mild chronic microvascular ischemic change.  Vascular: No hyperdense vessel or unexpected calcification.  Skull: Normal. Negative for fracture or focal lesion.  Sinuses/Orbits: Globes and orbits are unremarkable. Visualized sinuses are clear.  Other: None.  CT CERVICAL SPINE FINDINGS  Alignment: Mild reversal of the normal cervical lordosis, apex at C3-C4. No spondylolisthesis/subluxation.  Skull base and vertebrae: No acute fracture. No primary bone lesion or focal pathologic process.  Soft tissues and spinal canal: No prevertebral fluid or swelling. No visible canal hematoma.  Disc levels: Prior anterior cervical disc fusion at C5-C6. Mature bone graft material spans the disc interspace  and there is a well-seated anterior fixation plate and associated screws.  Moderate loss of disc height at C3-C4, C4-C5 and C6-C7. Mild disc bulging and endplate spurring at these levels. No convincing disc herniation.  Upper chest: Negative.  Other: None.  IMPRESSION: HEAD CT  1. No acute intracranial abnormalities. 2. Mild chronic microvascular ischemic change.  CERVICAL CT  1. No fracture or acute finding.   Electronically Signed By: Alm Parkins M.D. On: 11/25/2020 17:10   MR SHOULDER LEFT WO CONTRAST  Narrative CLINICAL DATA:  Left shoulder pain after fall on 11/25/2020. Shoulder dislocation status post reduction  EXAM: MRI OF THE LEFT SHOULDER WITHOUT CONTRAST  TECHNIQUE: Multiplanar, multisequence MR imaging of the shoulder was performed. No intravenous contrast was administered.  COMPARISON:  X-ray 11/25/2020  FINDINGS: Technical note: Despite efforts by the technologist and patient, motion artifact is present on today's exam and could not be eliminated. This reduces exam sensitivity and specificity.  Rotator cuff: Markedly abnormal appearance of the rotator cuff tendon is following recent dislocation event. Tendons are thickened and heterogeneous, likely representing a combination of tendinosis and strain.  Supraspinatus and infraspinatus tendons are intact inserting onto a moderately displaced greater tuberosity avulsion fracture fragment.  Muscles: Prominent intramuscular edema throughout the rotator cuff and visualized shoulder girdle musculature. No muscle atrophy or fatty infiltration.  Biceps long head: Partial-thickness tear of the intra-articular biceps tendon without rupture.  Acromioclavicular Joint: Intact. No significant arthropathy. Moderate volume subacromial-subdeltoid bursal fluid.  Glenohumeral Joint: Anatomic alignment of the glenohumeral joint status post reduction diffuse chondral thinning. Large complex  joint effusion/hemarthrosis.  Labrum: Complex anteroinferior labroligamentous injury evaluation of which is limited by motion degradation and the degree of surrounding soft tissue edema. The anterior band of the inferior glenohumeral ligament is irregular and may be torn.  Bones: Acute fracture of the greater tuberosity. Fracture fragment measures approximately 2.6 x 0.8 x 2.5 cm and is superomedially displaced by approximately 1.0 cm. No glenoid fracture is identified.  Other: Marked periarticular soft tissue edema. No organized fluid collection or hematoma.  IMPRESSION: 1. Sequela of recent anterior shoulder dislocation status post reduction. 2. Displaced greater tuberosity fracture. Supraspinatus and infraspinatus tendons appear intact inserting onto the displaced fracture fragment. 3. Complex anteroinferior labroligamentous injury evaluation of which is limited by motion degradation and the degree of surrounding soft tissue edema. The anterior band of the inferior glenohumeral ligament is irregular and may be torn. 4. Negative for glenoid fracture. 5. Partial-thickness tear of the intra-articular biceps tendon without rupture. 6. Large complex joint effusion/hemarthrosis.   Electronically Signed By: Mabel Converse D.O. On: 11/29/2020 08:10   DG Shoulder Left  Narrative CLINICAL DATA:  Patient fell over her dog at home. Patient complains of pain in the left shoulder all the way down to her shoulder. Patient also complains of neck pain.  EXAM: LEFT SHOULDER - 2+ VIEW  COMPARISON:  None.  FINDINGS: Fracture dislocation of the left shoulder. Specifically, the humeral head has dislocated anteriorly and slightly inferiorly. There is a comminuted fracture the greater tuberosity, displaced lateral to the humeral head by 2.3 cm.  No other fractures.  AC joint normally spaced and aligned.  IMPRESSION: 1. Anterior, inferior dislocation of the humeral head  and comminuted, displaced fracture of the greater tuberosity.   Electronically Signed By: Alm Parkins M.D. On: 11/25/2020 17:02   Narrative FINDINGS CLINICAL DATA:    BACK PAIN.  QUESTION MS. MRI THORACIC SPINE WITH AND WITHOUT CONTRAST NO COMPARISON MR SCANS OF THE THORACIC SPINE, ALTHOUGH RECENT CERVICAL SPINE MR HAS BEEN PERFORMED 07/29/01.  NO COMPARISON BRAIN MR. MILD THORACIC KYPHOSIS.  MILD DEGENERATIVE CHANGES THROUGHOUT THE THORACIC SPINE WITH SMALL TO MODERATE SIZED BROAD-BASED RIGHT POSTEROLATERAL T7-8 DISC PROTRUSION WITH MASS EFFECT UPON THE RIGHT VENTRAL ASPECT OF THE THECAL SAC AND SLIGHT POSTERIOR DISPLACEMENT OF THE ADJACENT SPINAL CORD.  JUST ABOVE THE T7-8 DISC PROTRUSION IS A FOCAL AREA OF ALTERED SIGNAL INTENSITY WITHIN THE THORACIC SPINAL CORD WHICH DEMONSTRATES MINIMAL ENHANCEMENT.  ALTHOUGH IT IS POSSIBLE RELATED TO THE DISC DISEASE, THIS IS SLIGHTLY DISPLACED FROM THE DISC PROTRUSION.  THIS FINDING COMBINED WITH QUESTIONABLE AREA OF ALTERED SIGNAL INTENSITY WITHIN THE THORACIC SPINAL CORD AT THE T4 LEVEL AS WELL AS THE ABNORMALITY NOTED ON THE CERVICAL SPINE MR RAISES THE POSSIBILITY OF DEMYELINATING PROCESS SUCH AS MULTIPLE SCLEROSIS.  OTHER CAUSES OF TRANSVERSE MYELITIS CANNOT BE COMPLETED EXCLUDED.  TUMOR IS FELT TO BE A LESS LIKELY CONSIDERATION.  CLINICAL AND LABORATORY CORRELATION AS WELL AS CORRELATION WITH MR OF THE BRAIN MAY BE CONSIDERED FOR FURTHER DELINEATION. MINIMAL  BULGES AT T8-9, T9-10, AND T10-11.  T11-12:  MODERATE DISC DEGENERATION WITH MODERATE BULGE, SLIGHTLY MORE NOTABLE RIGHT LATERAL POSITION. POSTERIOR ELEMENT HYPERTROPHY ALSO NOTED CONTRIBUTING TO MILD SPINAL STENOSIS. 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 IMPRESSION 1.  MODERATE SIZED BROAD-BASED RIGHT POSTEROLATERAL T7-8 DISC PROTRUSION WITH SLIGHT ENCROACHMENT UPON THE ADJACENT SPINAL CORD.  JUST ABOVE THE DISC SPACE LEVEL IS AN AREA OF ALTERED  SIGNAL INTENSITY WITHIN THE THORACIC SPINAL CORD WHICH DEMONSTRATES MINIMAL ENHANCEMENT.  ALTHOUGH IT IS POSSIBLE THIS IS RELATED TO THE DISC DISEASE, THE ADDITIONAL FINDING OF A SMALL AREA OF ALTERED SIGNAL INTENSITY WITHIN THE THORACIC SPINAL CORD AT THE T4 LEVEL AS WELL AS THE CERVICAL SPINE ABNORMALITIES RAISE THE POSSIBILITY OF UNDERLYING DEMYELINATING PROCESS SUCH AS MULTIPLE SCLEROSIS AS DISCUSSED ABOVE. 2.  LESS NOTABLE DEGREES OF DISC DEGENERATION  SEEN AT SEVERAL OTHER LEVELS, THE SECOND MOST NOTABLE LEVEL BEING THE T11-12 LEVEL, WHERE THERE IS MILD SPINAL STENOSIS CAUSED BY DISC BULGE COMBINED WITH POSTERIOR ELEMENT HYPERTROPHY.   Narrative CLINICAL DATA:  Fall, back pain  EXAM: THORACIC SPINE 2 VIEWS  COMPARISON:  None.  FINDINGS: Normal thoracic kyphosis. No acute fracture or listhesis of the thoracic spine. Vertebral body height has been preserved. There is intervertebral disc space narrowing, endplate remodeling, and vacuum disc phenomena throughout the mid and lower thoracic spine in keeping with changes of advanced degenerative disc disease. The paraspinal soft tissues are unremarkable.  IMPRESSION: Advanced degenerative disc disease of the mid and lower thoracic spine. No acute fracture or listhesis.   Electronically Signed By: Dorethia Molt M.D. On: 11/25/2020 19:16   MR LUMBAR SPINE WO CONTRAST  Narrative CLINICAL DATA:  Chronic low back pain for months. No acute injury or prior relevant surgery. History of multiple sclerosis.  EXAM: MRI LUMBAR SPINE WITHOUT CONTRAST  TECHNIQUE: Multiplanar, multisequence MR imaging of the lumbar spine was performed. No intravenous contrast was administered.  COMPARISON:  Lumbar MRI 10/28/2016, lumbar spine radiographs 05/13/2022 and abdominopelvic CT 12/26/2021.  FINDINGS: Segmentation: Conventional anatomy assumed, with the last open disc space designated L5-S1.Concordant with prior imaging.  Alignment:  The alignment is similar with a moderate convex right thoracolumbar scoliosis. At L5-S1, there is approximately 10 mm of anterolisthesis due to facet disease. Based on correlation with prior CT, no definite pars defect.  Vertebrae: No worrisome osseous lesion, acute fracture or pars defect. There are progressive multilevel endplate degenerative changes. The visualized sacroiliac joints appear unremarkable.  Conus medullaris: Extends to the L1 level and appears normal.  Paraspinal and other soft tissues: No significant paraspinal findings.  Disc levels:  Sagittal images demonstrate disc bulging and endplate osteophytes at T11-12, but no resulting cord deformity or high-grade foraminal narrowing.  T12-L1: Disc bulging and endplate osteophytes asymmetric to the left. Mild left foraminal narrowing.  L1-2: Progressive loss of disc height with disc bulging and endplate osteophytes asymmetric to the left. Mild bilateral facet hypertrophy. New mild to moderate left foraminal narrowing.  L2-3: Chronic loss of disc height with disc bulging and endplate osteophytes asymmetric to the left. Stable mild left lateral recess and left foraminal narrowing.  L3-4: Chronic loss of disc height with annular disc bulging and endplate osteophytes asymmetric to the right. Mild facet and ligamentous hypertrophy. Stable mild asymmetric right lateral recess and right foraminal narrowing.  L4-5: Stable loss of disc height with annular disc bulging and endplate osteophytes asymmetric to the right. Moderate facet and ligamentous hypertrophy, also worse on the right. Unchanged mild right-greater-than-left foraminal narrowing.  L5-S1: As above, chronic  degenerative grade 2 anterolisthesis with underlying disc space narrowing and disc uncovering. There is severe foraminal narrowing bilaterally with probable chronic bilateral L5 nerve root encroachment. Lateral recess narrowing appears progressive on the  right with possible right S1 nerve root encroachment.  IMPRESSION: 1. Compared with previous MRI from 2018, no acute findings are identified. 2. Multilevel spondylosis superimposed on a convex right thoracolumbar scoliosis. At L5-S1, there is a chronic degenerative grade 2 anterolisthesis with resulting severe foraminal narrowing bilaterally and probable chronic bilateral L5 nerve root encroachment. Lateral recess narrowing on the right appears progressive with possible right S1 nerve root encroachment. 3. Progressive spondylosis at L1-2 with mild to moderate left foraminal narrowing. 4. No acute osseous findings.   Electronically Signed By: Elsie Perone M.D. On: 06/02/2022 16:47  DG Lumbar Spine Complete  Narrative CLINICAL DATA:  Fall, back pain  EXAM: LUMBAR SPINE - COMPLETE 4+ VIEW  COMPARISON:  None.  FINDINGS: There is mild thoracolumbar dextroscoliosis, apex right at L1. Accentuated lumbar lordosis. Grade 2 anterolisthesis L5 upon S1. No acute fracture of the lumbar spine. Vertebral body heights have been preserved. There is intervertebral disc space narrowing, vacuum disc phenomena and endplate remodeling throughout the lumbar spine in keeping with changes of severe degenerative disc disease. Dystrophic calcifications within the posterior subcutaneous soft tissues is in keeping with fat necrosis. Vascular calcifications are seen within the iliac vasculature. The paraspinal soft tissues are unremarkable.  IMPRESSION: Diffuse severe degenerative disc disease. Superimposed grade 2 anterolisthesis L5 upon S1. No acute fracture.   Electronically Signed By: Dorethia Molt M.D. On: 11/25/2020 19:14  DG Wrist Complete Left  Narrative CLINICAL DATA:  Fall, left wrist pain  EXAM: LEFT WRIST - COMPLETE 3+ VIEW  COMPARISON:  None.  FINDINGS: There is no evidence of fracture or dislocation. There is no evidence of arthropathy or other focal bone  abnormality. Soft tissues are unremarkable.  IMPRESSION: Negative.   Electronically Signed By: Dorethia Molt M.D. On: 11/25/2020 19:14    Complexity Note: Imaging results reviewed.                         ROS  Cardiovascular: Daily Aspirin  intake Pulmonary or Respiratory: Difficulty blowing air out (Emphysema) and Smoking Neurological: Stroke (Residual deficits or weakness: not noted) and Curved spine Psychological-Psychiatric: Depressed Gastrointestinal: Vomiting blood (Ulcers) Genitourinary: Passing kidney stones Hematological: Brusing easily and Low platelet levels (Thrombocytopenia) Endocrine: No reported endocrine signs or symptoms such as high or low blood sugar, rapid heart rate due to high thyroid levels, obesity or weight gain due to slow thyroid or thyroid disease Rheumatologic: No reported rheumatological signs and symptoms such as fatigue, joint pain, tenderness, swelling, redness, heat, stiffness, decreased range of motion, with or without associated rash Musculoskeletal: Multiple sclerosis Work History: Out of work due to pain  Allergies  Angela Suarez is allergic to codeine, duloxetine, hydrocodone , oxycodone , prevacid [lansoprazole], prilosec [omeprazole], pyridium [phenazopyridine hcl], flagyl [metronidazole], and lamisil [terbinafine].  Laboratory Chemistry Profile   Renal Lab Results  Component Value Date   BUN 24 (H) 08/03/2023   CREATININE 0.87 08/03/2023   GFRAA >60 12/21/2015   GFRNONAA >60 08/03/2023   SPECGRAV 1.020 01/01/2022   PHUR 5.0 01/01/2022   PROTEINUR NEGATIVE 08/03/2023     Electrolytes Lab Results  Component Value Date   NA 142 08/03/2023   K 4.0 08/03/2023   CL 107 08/03/2023   CALCIUM  9.2 08/03/2023   MG 2.1 10/04/2013  Hepatic Lab Results  Component Value Date   AST 21 08/03/2023   ALT 16 08/03/2023   ALBUMIN 3.9 08/03/2023   ALKPHOS 66 08/03/2023   LIPASE 24 12/21/2015     ID Lab Results  Component Value Date    STAPHAUREUS NEGATIVE 08/03/2023   MRSAPCR NEGATIVE 08/03/2023     Bone No results found for: VD25OH, [id number], CI6874NY7, CI7874NY7, 25OHVITD1, 25OHVITD2, 25OHVITD3, TESTOFREE, TESTOSTERONE   Endocrine Lab Results  Component Value Date   GLUCOSE 81 08/03/2023   GLUCOSEU NEGATIVE 08/03/2023     Neuropathy No results found for: VITAMINB12, FOLATE, HGBA1C, HIV   CNS No results found for: COLORCSF, APPEARCSF, RBCCOUNTCSF, WBCCSF, POLYSCSF, LYMPHSCSF, EOSCSF, PROTEINCSF, GLUCCSF, JCVIRUS, CSFOLI, IGGCSF, LABACHR, ACETBL   Inflammation (CRP: Acute  ESR: Chronic) No results found for: CRP, ESRSEDRATE, LATICACIDVEN   Rheumatology No results found for: RF, ANA, LABURIC, URICUR, LYMEIGGIGMAB, LYMEABIGMQN, HLAB27   Coagulation Lab Results  Component Value Date   PLT 140 (L) 08/03/2023     Cardiovascular Lab Results  Component Value Date   CKTOTAL 49 10/03/2013   CKMB 1.1 10/03/2013   TROPONINI <0.03 12/21/2015   HGB 13.6 08/03/2023   HCT 42.7 08/03/2023     Screening Lab Results  Component Value Date   STAPHAUREUS NEGATIVE 08/03/2023   MRSAPCR NEGATIVE 08/03/2023     Cancer No results found for: CEA, CA125, LABCA2   Allergens No results found for: ALMOND, APPLE, ASPARAGUS, AVOCADO, BANANA, BARLEY, BASIL, BAYLEAF, GREENBEAN, LIMABEAN, WHITEBEAN, BEEFIGE, REDBEET, BLUEBERRY, BROCCOLI, CABBAGE, MELON, CARROT, CASEIN, CASHEWNUT, CAULIFLOWER, CELERY     Note: Lab results reviewed.  PFSH  Drug: Angela Suarez  reports no history of drug use. Alcohol:  reports no history of alcohol use. Tobacco:  reports that she has been smoking cigarettes. She has a 39.8 pack-year smoking history. She has been exposed to tobacco smoke. She has never used smokeless tobacco. Medical:  has a past medical history of Allergic rhinitis, Anxiety, Arthritis, Cancer (HCC),  Cervical disc disease, 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, COPD (chronic obstructive pulmonary disease) (HCC), Coronary artery calcification seen on CAT scan, Depression, GERD (gastroesophageal reflux disease), Gross hematuria, Headache, History of kidney stones, Hypertension, IBS (irritable bowel syndrome), Lumbar disc disease, Lupus anticoagulant disorder (HCC), Lupus anticoagulant positive, Melanoma in situ (HCC), Multiple sclerosis (HCC), Osteoarthritis of right knee, Senile purpura (HCC), Smokers' cough (HCC), Spinal stenosis, lumbar, Stroke [facility]), Thoracic aortic atherosclerosis (HCC), Thoracic disc disease, Thrombocytopenia (HCC), Vitamin B 12 deficiency, and Vitamin D  deficiency. Family: family history includes Breast cancer (age of onset: 57) in her mother; Breast cancer (age of onset: 37) in her paternal aunt and paternal aunt; Cancer in her father and mother.  Past Surgical History:  Procedure Laterality Date   ABDOMINAL HYSTERECTOMY     BACK SURGERY  1997   Cervical spine   BREAST EXCISIONAL BIOPSY Right 1990   x 2, neg   BREAST SURGERY     CATARACT EXTRACTION W/PHACO Left 06/12/2022   Procedure: CATARACT EXTRACTION PHACO AND INTRAOCULAR LENS PLACEMENT (IOC) LEFT CLAREON TORIC 8.14 00:43.0;  Surgeon: Enola Feliciano Hugger, MD;  Location: [facility];  Service: Ophthalmology;  Laterality: Left;   CATARACT EXTRACTION W/PHACO Right 06/26/2022   Procedure: CATARACT EXTRACTION PHACO AND INTRAOCULAR LENS PLACEMENT (IOC) RIGHT CLAREON TORIC 7.23 00:46.0;  Surgeon: Enola Feliciano Hugger, MD;  Location: [facility];  Service: Ophthalmology;  Laterality: Right;   colon polyps     colonoscipy     COLONOSCOPY N/A 05/07/2022  Procedure: COLONOSCOPY;  Surgeon: Toledo, Ladell POUR, MD;  Location: [facility] ENDOSCOPY;  Service: Endoscopy;  Laterality: N/A;   COLONOSCOPY WITH PROPOFOL  N/A 09/15/2016   Procedure: COLONOSCOPY WITH PROPOFOL ;  Surgeon: Viktoria Lamar DASEN, MD;   Location: [facility] ENDOSCOPY;  Service: Endoscopy;  Laterality: N/A;   ESOPHAGOGASTRODUODENOSCOPY N/A 05/07/2022   Procedure: ESOPHAGOGASTRODUODENOSCOPY (EGD);  Surgeon: Toledo, Ladell POUR, MD;  Location: [facility] ENDOSCOPY;  Service: Endoscopy;  Laterality: N/A;   FRACTURE SURGERY     ORIF HUMERUS FRACTURE Left 12/06/2020   Procedure: OPEN REDUCTION INTERNAL FIXATION (ORIF) PROXIMAL HUMERUS GREATER TUBEROSITY FRACTURE.;  Surgeon: Edie Norleen PARAS, MD;  Location: [facility] ORS;  Service: Orthopedics;  Laterality: Left;   POLYPECTOMY Right 1990   throat polyp removed by Dr Arley.    TONSILLECTOMY     TOTAL KNEE ARTHROPLASTY Right 08/11/2023   Procedure: ARTHROPLASTY, KNEE, TOTAL;  Surgeon: Edie Norleen PARAS, MD;  Location: [facility] ORS;  Service: Orthopedics;  Laterality: Right;   Active Ambulatory Problems    Diagnosis Date Noted   Status post total knee replacement using cement, right 08/11/2023   Degeneration of intervertebral disc of lumbar region with discogenic back pain 10/06/2023   Spinal stenosis, lumbar region, with neurogenic claudication 10/06/2023   Foraminal stenosis of lumbar region 10/06/2023   Resolved Ambulatory Problems    Diagnosis Date Noted   No Resolved Ambulatory Problems   Past Medical History:  Diagnosis Date   Allergic rhinitis    Anxiety    Arthritis    Cancer (HCC)    Cervical disc disease    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    COPD (chronic obstructive pulmonary disease) (HCC)    Coronary artery calcification seen on CAT scan    Depression    GERD (gastroesophageal reflux disease)    Gross hematuria    Headache    History of kidney stones    Hypertension    IBS (irritable bowel syndrome)    Lumbar disc disease    Lupus anticoagulant disorder (HCC)    Lupus anticoagulant positive    Melanoma in situ (HCC)    Multiple sclerosis (HCC)    Osteoarthritis of right knee    Senile purpura (HCC)    Smokers' cough (HCC)    Spinal stenosis, lumbar     Stroke [facility])    Thoracic aortic atherosclerosis (HCC)    Thoracic disc disease    Thrombocytopenia (HCC)    Vitamin B 12 deficiency    Vitamin D  deficiency    Constitutional Exam  General appearance: Well nourished, well developed, and well hydrated. In no apparent acute distress Vitals:   10/06/23 1301  BP: 126/76  Pulse: 75  Resp: 15  Temp: 97.9 F (36.6 C)  SpO2: 99%  Weight: 130 lb (59 kg)  Height: 5' 1.5 (1.562 m)   BMI Assessment: Estimated body mass index is 24.17 kg/m as calculated from the following:   Height as of this encounter: 5' 1.5 (1.562 m).   Weight as of this encounter: 130 lb (59 kg).  BMI interpretation table: BMI level Category Range association with higher incidence of chronic pain  <18 kg/m2 Underweight   18.5-24.9 kg/m2 Ideal body weight   25-29.9 kg/m2 Overweight Increased incidence by 20%  30-34.9 kg/m2 Obese (Class I) Increased incidence by 68%  35-39.9 kg/m2 Severe obesity (Class II) Increased incidence by 136%  >40 kg/m2 Extreme obesity (Class III) Increased incidence by 254%   Patient's current BMI Ideal Body weight  Body mass index is  24.17 kg/m. Ideal body weight: 48.9 kg (107 lb 14.6 oz) Adjusted ideal body weight: 53 kg (116 lb 12 oz)   BMI Readings from Last 4 Encounters:  10/06/23 24.17 kg/m  08/11/23 24.69 kg/m  08/03/23 24.84 kg/m  06/26/22 24.69 kg/m   Wt Readings from Last 4 Encounters:  10/06/23 130 lb (59 kg)  08/11/23 135 lb (61.2 kg)  08/03/23 135 lb 12.8 oz (61.6 kg)  06/26/22 135 lb (61.2 kg)    Psych/Mental status: Alert, oriented x 3 (person, place, & time)       Eyes: PERLA Respiratory: No evidence of acute respiratory distress  Thoracic Spine Area Exam  Skin & Axial Inspection: prominent thoracic Kyphosis Alignment: Symmetrical Functional ROM: Pain restricted ROM Stability: No instability detected Muscle Tone/Strength: Functionally intact. No obvious neuro-muscular anomalies detected. Sensory  (Neurological): Musculoskeletal pain pattern Muscle strength & Tone: No palpable anomalies Lumbar Spine Area Exam  Skin & Axial Inspection: Thoraco-lumbar Scoliosis Alignment: Symmetrical Functional ROM: Pain restricted ROM       Stability: No instability detected Muscle Tone/Strength: Functionally intact. No obvious neuro-muscular anomalies detected. Sensory (Neurological): Musculoskeletal pain pattern Palpation: Complains of area being tender to palpation        Ambulation: Patient ambulates using a walker Gait: Antalgic gait (limping) Posture: Difficulty standing up straight, due to pain  Lower Extremity Exam    Side: Right lower extremity  Side: Left lower extremity  Stability: No instability observed          Stability: No instability observed          Skin & Extremity Inspection: Skin color, temperature, and hair growth are WNL. No peripheral edema or cyanosis. No masses, redness, swelling, asymmetry, or associated skin lesions. No contractures.  Skin & Extremity Inspection: Skin color, temperature, and hair growth are WNL. No peripheral edema or cyanosis. No masses, redness, swelling, asymmetry, or associated skin lesions. No contractures.  Functional ROM: Pain restricted ROM for hip and knee joints          Functional ROM: Pain restricted ROM for hip and knee joints          Muscle Tone/Strength: Functionally intact. No obvious neuro-muscular anomalies detected.  Muscle Tone/Strength: Functionally intact. No obvious neuro-muscular anomalies detected.  Sensory (Neurological): Musculoskeletal pain pattern        Sensory (Neurological): Musculoskeletal pain pattern        DTR: Patellar: deferred today Achilles: deferred today Plantar: deferred today  DTR: Patellar: deferred today Achilles: deferred today Plantar: deferred today  Palpation: No palpable anomalies  Palpation: No palpable anomalies    Assessment  Primary Diagnosis & Pertinent Problem List: The primary encounter  diagnosis was Lumbar facet arthropathy. Diagnoses of Lumbar spondylosis, Degeneration of intervertebral disc of lumbar region with discogenic back pain, Spinal stenosis, lumbar region, with neurogenic claudication, and Foraminal stenosis of lumbar region were also pertinent to this visit.  Visit Diagnosis (New problems to examiner): 1. Lumbar facet arthropathy   2. Lumbar spondylosis   3. Degeneration of intervertebral disc of lumbar region with discogenic back pain   4. Spinal stenosis, lumbar region, with neurogenic claudication   5. Foraminal stenosis of lumbar region    Plan of Care (Initial workup plan)  I had extensive discussion with the patient about the goals of pain management.  We discussed nonpharmacological approaches to pain management that include physical therapy, dieting, sleep hygiene, psychotherapy, interventional therapy.  We discussed the importance of understanding the type of pain including neuropathic, nociceptive, centralized.  I also stressed the importance of multimodal analgesia with an emphasis on nondrug modalities including self management, behavioral health support and physical therapy.  We discussed the importance of physical therapy and how a individualized physical therapy and occupational therapy program tailored to patient limitations can be helpful at improving physical function. We also discussed the importance of insomnia and disrupted sleep and how improved sleep hygiene and cognitive therapy could be helpful.  Psychotherapy including CBT, mind-body therapies, pain coping strategies can be helpful for patients whose pain impacts mood, sleep, quality of life, relationships with others.  We discussed avoiding benzodiazepines.  I also had an extensive discussion with the patient about interventional therapies which is my expertise and how these could be incorporated into an effective multimodal pain management plan.     Chronic pain   Chronic pain in the lower  back, extending to the hip and abdomen, has persisted for two to three years, exacerbated by degenerative disc disease with severe lumbar foraminal stenosis. She ambulates with assistance due to pain severity and has pronounced lordosis. Currently on Lyrica , she has not tried physical therapy. Refer to [facility] to build strength and learn stretches. Discuss potential future use of a buprenorphine patch for pain management if needed.  Degenerative disc disease with severe foraminal stenosis   Significant degenerative disc disease with severe foraminal stenosis contributes to chronic lower back pain, pronounced lordosis, and decreased height over the years.  Consider diagnostic lumbar facet medial branch nerve blocks  IT band syndrome   Pain in the legs is attributed to IT band syndrome and is not related to the chronic back pain.  Multiple sclerosis   Multiple sclerosis contributes to her need for ambulatory assistance.  Post knee replacement   Eight weeks post right knee replacement surgery, recovery is progressing well. She has not taken Dilaudid  for two weeks.  Petechiae   Petechiae are likely due to previous high-dose aspirin  therapy post-surgery. She is currently on baby aspirin . Avoid NSAIDs like ibuprofen and Advil due to petechiae. Continue baby aspirin .        Referral Orders         Ambulatory referral to Physical Therapy    Provider-requested follow-up: Return in about 6 weeks (around 11/17/2023) for 2nd pt visit (discuss PT, Butrans, L-fcts).  Future Appointments  Date Time Provider Department Center  09/27/2024  2:30 PM Jackquline Sawyer, MD [facility] None   I discussed the assessment and treatment plan with the patient. The patient was provided an opportunity to ask questions and all were answered. The patient agreed with the plan and demonstrated an understanding of the instructions.  Patient advised to call back or seek an in-person evaluation if the symptoms or  condition worsens.  Duration of encounter: .  Total time on encounter, as per AMA guidelines included both the face-to-face and non-face-to-face time personally spent by the physician and/or other qualified health care professional(s) on the day of the encounter (includes time in activities that require the physician or other qualified health care professional and does not include time in activities normally performed by clinical staff). Physician's time may include the following activities when performed: Preparing to see the patient (e.g., pre-charting review of records, searching for previously ordered imaging, lab work, and nerve conduction tests) Review of prior analgesic pharmacotherapies. Reviewing PMP Interpreting ordered tests (e.g., lab work, imaging, nerve conduction tests) Performing post-procedure evaluations, including interpretation of diagnostic procedures Obtaining and/or reviewing separately obtained history Performing a medically appropriate examination  and/or evaluation Counseling and educating the patient/family/caregiver Ordering medications, tests, or procedures Referring and communicating with other health care professionals (when not separately reported) Documenting clinical information in the electronic or other health record Independently interpreting results (not separately reported) and communicating results to the patient/ family/caregiver Care coordination (not separately reported)  Note by: Wallie Sherry, MD (TTS and AI technology used. I apologize for any typographical errors that were not detected and corrected.) Date: 10/06/2023; Time: 1:56 PM

## 2023-11-05 ENCOUNTER — Other Ambulatory Visit: Payer: Self-pay | Admitting: Neurology

## 2023-11-05 DIAGNOSIS — G35 Multiple sclerosis: Secondary | ICD-10-CM

## 2023-11-09 ENCOUNTER — Ambulatory Visit

## 2023-11-09 ENCOUNTER — Ambulatory Visit
Admission: RE | Admit: 2023-11-09 | Discharge: 2023-11-09 | Disposition: A | Discharge status: Home / Self Care | Source: Ambulatory Visit | Attending: Neurology | Admitting: Neurology

## 2023-11-09 DIAGNOSIS — G35 Multiple sclerosis: Secondary | ICD-10-CM | POA: Insufficient documentation

## 2023-11-17 ENCOUNTER — Ambulatory Visit: Admitting: Student in an Organized Health Care Education/Training Program

## 2023-12-10 ENCOUNTER — Ambulatory Visit

## 2023-12-10 DIAGNOSIS — C4492 Squamous cell carcinoma of skin, unspecified: Secondary | ICD-10-CM

## 2023-12-10 DIAGNOSIS — C44629 Squamous cell carcinoma of skin of left upper limb, including shoulder: Secondary | ICD-10-CM

## 2023-12-10 DIAGNOSIS — D492 Neoplasm of unspecified behavior of bone, soft tissue, and skin: Secondary | ICD-10-CM

## 2023-12-10 HISTORY — DX: Squamous cell carcinoma of skin, unspecified: C44.92

## 2023-12-10 NOTE — Progress Notes (Signed)
   Follow-Up Visit   Subjective  Angela Suarez is a 73 y.o. female who presents for the following: Place at left upper arm appeared about 2.5 weeks ago, occasionally itches. Patient with hx of melanoma in situ.    The following portions of the chart were reviewed this encounter and updated as appropriate: medications, allergies, medical history  Review of Systems:  No other skin or systemic complaints except as noted in HPI or Assessment and Plan.  Objective  Well appearing patient in no apparent distress; mood and affect are within normal limits.  A focused examination was performed of the following areas: Left arm   Relevant exam findings are noted in the Assessment and Plan. Left lateral arm 1.5 cm pink exophytic plaque    Assessment & Plan   NEOPLASM OF SKIN Left lateral arm Epidermal / dermal shaving  Lesion diameter (cm):  1.5 Informed consent: discussed and consent obtained   Timeout: patient name, date of birth, surgical site, and procedure verified   Procedure prep:  Patient was prepped and draped in usual sterile fashion Prep type:  Isopropyl alcohol Anesthesia: the lesion was anesthetized in a standard fashion   Anesthetic:  1% lidocaine  w/ epinephrine  1-100,000 buffered w/ 8.4% NaHCO3 Instrument used: DermaBlade   Hemostasis achieved with: pressure and aluminum chloride   Outcome: patient tolerated procedure well   Post-procedure details: wound care instructions given    Specimen 1 - Surgical pathology Differential Diagnosis: R/o keratoacanthoma   Check Margins: No 1.5 cm pink exophytic plaque  Return for PRN pending biopsy results .  I, Jacquelynn V. Wilfred, CMA, am acting as scribe for Lauraine JAYSON Kanaris, MD .   Documentation: I have reviewed the above documentation for accuracy and completeness, and I agree with the above.  Lauraine JAYSON Kanaris, MD

## 2023-12-10 NOTE — Patient Instructions (Addendum)

## 2023-12-14 LAB — SURGICAL PATHOLOGY

## 2023-12-17 ENCOUNTER — Ambulatory Visit
Attending: Student in an Organized Health Care Education/Training Program | Admitting: Student in an Organized Health Care Education/Training Program

## 2023-12-17 ENCOUNTER — Encounter: Payer: Self-pay | Admitting: Student in an Organized Health Care Education/Training Program

## 2023-12-17 VITALS — BP 116/72 | HR 83 | Temp 97.6°F | Resp 18 | Ht 62.0 in | Wt 130.0 lb

## 2023-12-17 DIAGNOSIS — M47816 Spondylosis without myelopathy or radiculopathy, lumbar region: Secondary | ICD-10-CM | POA: Diagnosis present

## 2023-12-17 DIAGNOSIS — M48062 Spinal stenosis, lumbar region with neurogenic claudication: Secondary | ICD-10-CM | POA: Diagnosis not present

## 2023-12-17 DIAGNOSIS — M48061 Spinal stenosis, lumbar region without neurogenic claudication: Secondary | ICD-10-CM | POA: Diagnosis not present

## 2023-12-17 DIAGNOSIS — M5136 Other intervertebral disc degeneration, lumbar region with discogenic back pain only: Secondary | ICD-10-CM | POA: Diagnosis present

## 2023-12-17 NOTE — Progress Notes (Signed)
 Safety precautions to be maintained throughout the outpatient stay will include: orient to surroundings, keep bed in low position, maintain call bell within reach at all times, provide assistance with transfer out of bed and ambulation.

## 2023-12-17 NOTE — Progress Notes (Signed)
 PROVIDER NOTE: Interpretation of information contained herein should be left to medically-trained personnel. Specific patient instructions are provided elsewhere under Patient Instructions section of medical record. This document was created in part using AI and STT-dictation technology, any transcriptional errors that may result from this process are unintentional.  Patient: Angela Suarez  Service: E/M   PCP: Fernande Ophelia JINNY DOUGLAS, MD  DOB: March 28, 1951  DOS: 12/17/2023  Provider: Wallie Sherry, MD  MRN: [id number]  Delivery: Face-to-face  Specialty: Interventional Pain Management  Type: Established Patient  Setting: Ambulatory outpatient facility  Specialty designation: 09  Referring Prov.: Fernande Ophelia JINNY DOUGLAS, MD  Location: Outpatient office facility       History of present illness (HPI) [name], a 73 y.o. year old female, is here today because of her Lumbar facet arthropathy [M47.816]. [name] primary complain today is Back Pain (From waist all the way up to shoulder blades)  Pertinent problems: [name] does not have any pertinent problems on file.  Pain Assessment: Severity of Chronic pain is reported as a 5 /10. Location: Back Lower, Mid, Upper/radites up back. Onset: More than a month ago. Quality: Sharp. Timing: Intermittent. Modifying factor(s): PT sitting in straight chair. Vitals:  height is 5' 2 (1.575 m) and weight is 130 lb (59 kg). Her temperature is 97.6 F (36.4 C). Her blood pressure is 116/72 and her pulse is 83. Her respiration is 18 and oxygen saturation is 96%.  BMI: Estimated body mass index is 23.78 kg/m as calculated from the following:   Height as of this encounter: 5' 2 (1.575 m).   Weight as of this encounter: 130 lb (59 kg).  Last encounter: 10/06/2023. Last procedure: Visit date not found.  Reason for encounter:   Discussed the use of AI scribe software for clinical note transcription with the patient, who gave verbal consent to  proceed.  History of Present Illness   Angela Suarez is a 73 year old female with chronic back pain who presents for follow-up on her pain management.  She experiences chronic back pain originating in her lower back and radiating to her shoulder blade. She previously underwent back ablations three to four years ago.  She is currently attending physical therapy, which she finds beneficial for both her physical and mental well-being. The therapy focuses on strengthening her core and thighs, particularly addressing her iliotibial band syndrome. Pain relief from physical therapy typically lasts for two to three days.  She uses heat or ice at home to manage her pain, depending on what she feels is more appropriate at the time. Walking long distances, such as [place] to the medical arts [place], exacerbates her back pain.  She is working on improving her posture, as the pain has affected it over the years. She tries to keep her shoulders back and down, even when in pain, and finds that this helps ease the discomfort over time.  She has not been walking outside much due to a tendency to fall, especially since she is alone during the day while her husband is at work. Instead, she enjoys sitting on her front porch, focusing on her posture and breathing, and uses a foot pedal exerciser to maintain leg activity.       HPI from initial clinic visit: History of Present Illness   Angela Suarez is a 73 year old female with chronic pain and multiple sclerosis who presents with worsening back pain.   She has experienced  chronic back pain that has significantly worsened over the past two to three years. The pain is primarily located in the lower back, radiating to the hip and occasionally causing abdominal pain reminiscent of menstrual cramps. The severity of the pain affects her posture and mobility, necessitating the use of an assistive device when walking outside the house.   Her medical  history includes multiple sclerosis and recent knee replacement surgery eight weeks ago, which is healing well. 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 Her past medical history includes significant degenerative disc disease and severe foraminal stenosis. She has not yet engaged in physical therapy for her back pain.   Her current medication regimen includes Lyrica , with a dose of 150 mg at bedtime and 75 mg at lunch. She previously tried gabapentin without relief. She was on 325 mg of aspirin  twice daily prior to her knee surgery to prevent blood clots, but has since switched to a baby aspirin  regimen. She occasionally uses Tylenol  for muscle back pain and Excedrin for headaches, but has not taken Dilaudid  for two weeks, which she used post-surgery for knee pain.   No current use of NSAIDs like ibuprofen or Advil due to previous issues with petechiae. She prefers physical therapy at [facility], where she has had positive experiences in the past.    ROS  Constitutional: Denies any fever or chills Gastrointestinal: No reported hemesis, hematochezia, vomiting, or acute GI distress Musculoskeletal: Low back pain Neurological: No reported episodes of acute onset apraxia, aphasia, dysarthria, agnosia, amnesia, paralysis, loss of coordination, or loss of consciousness  Medication Review  HYDROmorphone , amLODipine , aspirin  EC, atorvastatin , cholecalciferol , clonazePAM , cyanocobalamin , pregabalin , sertraline , traMADol , triamcinolone , and trolamine salicylate  History Review  Allergy: Angela Suarez is allergic to codeine, duloxetine, hydrocodone , oxycodone , prevacid [lansoprazole], prilosec [omeprazole], pyridium [phenazopyridine hcl], flagyl [metronidazole], and lamisil [terbinafine]. Drug: Angela Suarez  reports no history of drug use. Alcohol:  reports no history of alcohol use. Tobacco:  reports that she has been smoking  cigarettes. She has a 39.8 pack-year smoking history. She has been exposed to tobacco smoke. She has never used smokeless tobacco. Social: Angela Suarez  reports that she has been smoking cigarettes. She has a 39.8 pack-year smoking history. She has been exposed to tobacco smoke. She has never used smokeless tobacco. She reports that she does not drink alcohol and does not use drugs. Medical:  has a past medical history of Allergic rhinitis, Anxiety, Arthritis, Cancer (HCC), Cervical disc disease, 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, COPD (chronic obstructive pulmonary disease) (HCC), Coronary artery calcification seen on CAT scan, Depression, GERD (gastroesophageal reflux disease), Gross hematuria, Headache, History of kidney stones, Hypertension, IBS (irritable bowel syndrome), Lumbar disc disease, Lupus anticoagulant disorder (HCC), Lupus anticoagulant positive, Melanoma in situ (HCC), Multiple sclerosis (HCC), Osteoarthritis of right knee, Senile purpura (HCC), Smokers' cough (HCC), Spinal stenosis, lumbar, Stroke [employer]), Thoracic aortic atherosclerosis (HCC), Thoracic disc disease, Thrombocytopenia (HCC), Vitamin B 12 deficiency, and Vitamin D  deficiency. Surgical: Angela Suarez  has a past surgical history that includes colon polyps; Abdominal hysterectomy; Tonsillectomy; colonoscipy; Polypectomy (Right, 1990); Colonoscopy with propofol  (N/A, 09/15/2016); Breast excisional biopsy (Right, 1990); ORIF humerus fracture (Left, 12/06/2020); Breast surgery; Fracture surgery; Colonoscopy (N/A, 05/07/2022); Esophagogastroduodenoscopy (N/A, 05/07/2022); Back surgery (1997); Cataract extraction w/PHACO (Left, 06/12/2022); Cataract extraction w/PHACO (Right, 06/26/2022); and Total knee arthroplasty (Right, 08/11/2023). Family: family history includes Breast cancer (age of onset: 66) in her  mother; Breast cancer (age of onset: 25) in her paternal aunt and paternal aunt; Cancer in her father and  mother.  Laboratory Chemistry Profile   Renal Lab Results  Component Value Date   BUN 24 (H) 08/03/2023   CREATININE 0.87 08/03/2023   GFRAA >60 12/21/2015   GFRNONAA >60 08/03/2023    Hepatic Lab Results  Component Value Date   AST 21 08/03/2023   ALT 16 08/03/2023   ALBUMIN 3.9 08/03/2023   ALKPHOS 66 08/03/2023   LIPASE 24 12/21/2015    Electrolytes Lab Results  Component Value Date   NA 142 08/03/2023   K 4.0 08/03/2023   CL 107 08/03/2023   CALCIUM  9.2 08/03/2023   MG 2.1 10/04/2013    Bone No results found for: VD25OH, VD125OH2TOT, CI6874NY7, CI7874NY7, 25OHVITD1, 25OHVITD2, 25OHVITD3, TESTOFREE, TESTOSTERONE  Inflammation (CRP: Acute Phase) (ESR: Chronic Phase) No results found for: CRP, ESRSEDRATE, LATICACIDVEN       Note: Above Lab results reviewed.  Recent Imaging Review  MR BRAIN WO CONTRAST CLINICAL DATA:  Multiple sclerosis.  EXAM: MRI HEAD WITHOUT CONTRAST  TECHNIQUE: Multiplanar, multiecho pulse sequences of the brain and surrounding structures were obtained without intravenous contrast.  COMPARISON:  Head MRI 05/19/2022  FINDINGS: Brain: There is no evidence of an acute infarct, intracranial hemorrhage, mass, midline shift, or extra-axial fluid collection. There is mild cerebral atrophy. T2 hyperintensities in the cerebral white matter bilaterally are unchanged and mildly advanced for age. The periventricular white matter is involved to the greatest extent, although scattered juxtacortical/subcortical lesions are also present. None demonstrate restricted diffusion.  Vascular: Major intracranial vascular flow voids are preserved.  Skull and upper cervical spine: Unremarkable bone marrow signal.  Sinuses/Orbits: Bilateral cataract extraction. No evidence of significant inflammatory sinus disease. Trace right mastoid fluid.  Other: None.  IMPRESSION: History of multiple sclerosis with unchanged white matter  disease. No acute intracranial abnormality  Electronically Signed   By: Dasie Hamburg M.D.   On: 11/16/2023 15:41   MR LUMBAR SPINE WO CONTRAST   Narrative CLINICAL DATA:  Chronic low back pain for months. No acute injury or prior relevant surgery. History of multiple sclerosis.   EXAM: MRI LUMBAR SPINE WITHOUT CONTRAST   TECHNIQUE: Multiplanar, multisequence MR imaging of the lumbar spine was performed. No intravenous contrast was administered.   COMPARISON:  Lumbar MRI 10/28/2016, lumbar spine radiographs 05/13/2022 and abdominopelvic CT 12/26/2021.   FINDINGS: Segmentation: Conventional anatomy assumed, with the last open disc space designated L5-S1.Concordant with prior imaging.   Alignment: The alignment is similar with a moderate convex right thoracolumbar scoliosis. At L5-S1, there is approximately 10 mm of anterolisthesis due to facet disease. Based on correlation with prior CT, no definite pars defect.   Vertebrae: No worrisome osseous lesion, acute fracture or pars defect. There are progressive multilevel endplate degenerative changes. The visualized sacroiliac joints appear unremarkable.   Conus medullaris: Extends to the L1 level and appears normal.   Paraspinal and other soft tissues: No significant paraspinal findings.   Disc levels:   Sagittal images demonstrate disc bulging and endplate osteophytes at T11-12, but no resulting cord deformity or high-grade foraminal narrowing.   T12-L1: Disc bulging and endplate osteophytes asymmetric to the left. Mild left foraminal narrowing.   L1-2: Progressive loss of disc height with disc bulging and endplate osteophytes asymmetric to the left. Mild bilateral facet hypertrophy. New mild to moderate left foraminal narrowing.   L2-3: Chronic loss of disc height with disc bulging and endplate osteophytes asymmetric  to the left. Stable mild left lateral recess and left foraminal narrowing.   L3-4: Chronic loss  of disc height with annular disc bulging and endplate osteophytes asymmetric to the right. Mild facet and ligamentous hypertrophy. Stable mild asymmetric right lateral recess and right foraminal narrowing.   L4-5: Stable loss of disc height with annular disc bulging and endplate osteophytes asymmetric to the right. Moderate facet and ligamentous hypertrophy, also worse on the right. Unchanged mild right-greater-than-left foraminal narrowing.   L5-S1: As above, chronic degenerative grade 2 anterolisthesis with underlying disc space narrowing and disc uncovering. There is severe foraminal narrowing bilaterally with probable chronic bilateral L5 nerve root encroachment. Lateral recess narrowing appears progressive on the right with possible right S1 nerve root encroachment.   IMPRESSION: 1. Compared with previous MRI from 2018, no acute findings are identified. 2. Multilevel spondylosis superimposed on a convex right thoracolumbar scoliosis. At L5-S1, there is a chronic degenerative grade 2 anterolisthesis with resulting severe foraminal narrowing bilaterally and probable chronic bilateral L5 nerve root encroachment. Lateral recess narrowing on the right appears progressive with possible right S1 nerve root encroachment. 3. Progressive spondylosis at L1-2 with mild to moderate left foraminal narrowing. 4. No acute osseous findings.  Note: Reviewed        Physical Exam  Vitals: BP 116/72   Pulse 83   Temp 97.6 F (36.4 C)   Resp 18   Ht 5' 2 (1.575 m)   Wt 130 lb (59 kg)   SpO2 96%   BMI 23.78 kg/m  BMI: Estimated body mass index is 23.78 kg/m as calculated from the following:   Height as of this encounter: 5' 2 (1.575 m).   Weight as of this encounter: 130 lb (59 kg). Ideal: Ideal body weight: 50.1 kg (110 lb 7.2 oz) Adjusted ideal body weight: 53.6 kg (118 lb 4.3 oz) General appearance: Well nourished, well developed, and well hydrated. In no apparent acute  distress Mental status: Alert, oriented x 3 (person, place, & time)       Respiratory: No evidence of acute respiratory distress Eyes: PERLA  Thoracic Spine Area Exam  Skin & Axial Inspection: prominent thoracic Kyphosis Alignment: Symmetrical Functional ROM: Pain restricted ROM Stability: No instability detected Muscle Tone/Strength: Functionally intact. No obvious neuro-muscular anomalies detected. Sensory (Neurological): Musculoskeletal pain pattern Muscle strength & Tone: No palpable anomalies Lumbar Spine Area Exam  Skin & Axial Inspection: Thoraco-lumbar Scoliosis Alignment: Symmetrical Functional ROM: Pain restricted ROM       Stability: No instability detected Muscle Tone/Strength: Functionally intact. No obvious neuro-muscular anomalies detected. Sensory (Neurological): Musculoskeletal pain pattern Palpation: Complains of area being tender to palpation         Ambulation: Patient ambulates using a walker Gait: Antalgic gait (limping) Posture: Difficulty standing up straight, due to pain  Lower Extremity Exam      Side: Right lower extremity   Side: Left lower extremity  Stability: No instability observed           Stability: No instability observed          Skin & Extremity Inspection: Skin color, temperature, and hair growth are WNL. No peripheral edema or cyanosis. No masses, redness, swelling, asymmetry, or associated skin lesions. No contractures.   Skin & Extremity Inspection: Skin color, temperature, and hair growth are WNL. No peripheral edema or cyanosis. No masses, redness, swelling, asymmetry, or associated skin lesions. No contractures.  Functional ROM: Pain restricted ROM for hip and knee joints  Functional ROM: Pain restricted ROM for hip and knee joints          Muscle Tone/Strength: Functionally intact. No obvious neuro-muscular anomalies detected.   Muscle Tone/Strength: Functionally intact. No obvious neuro-muscular anomalies detected.  Sensory  (Neurological): Musculoskeletal pain pattern         Sensory (Neurological): Musculoskeletal pain pattern        DTR: Patellar: deferred today Achilles: deferred today Plantar: deferred today   DTR: Patellar: deferred today Achilles: deferred today Plantar: deferred today  Palpation: No palpable anomalies   Palpation: No palpable anomalies    Assessment   Diagnosis Status  1. Lumbar facet arthropathy   2. Degeneration of intervertebral disc of lumbar region with discogenic back pain   3. Spinal stenosis, lumbar region, with neurogenic claudication   4. Lumbar spondylosis   5. Foraminal stenosis of lumbar region    Controlled Controlled Controlled   Updated Problems: No problems updated.  Plan of Care  Assessment and Plan    Chronic low back pain   Chronic low back pain radiates to the shoulder blade, likely due to facet joint degeneration. Pain worsens with walking and improves with physical therapy, which currently provides significant relief. Previous ablation was performed 3-4 years ago. Continue physical therapy at [facility] and send a referral for insurance purposes. Consider nerve blocks or repeat ablation if pain worsens. If these are ineffective, consider a spinal cord or PN stimulator.  Impaired mobility due to pain   Chronic low back pain impairs mobility, causing difficulty walking long distances and increasing fall risk. Physical therapy aids in improving mobility and posture. Encourage the use of heat or ice for pain relief as needed and posture correction exercises. Consider using a foot pedal exerciser at home to improve blood flow and muscle activation. Advise sitting outside for fresh air and mental well-being.  Iliotibial band syndrome   Iliotibial band syndrome is managed with physical therapy focusing on strengthening the core and thighs. She reports improvement in symptoms with therapy. Continue physical therapy focusing on core and thigh  strengthening.       Orders:  Orders Placed This Encounter  Procedures   Ambulatory referral to Physical Therapy    Referral Priority:   Routine    Referral Type:   Physical Medicine    Referral Reason:   Specialty Services Required    Requested Specialty:   Physical Therapy    Number of Visits Requested:   1    Return for patient will call to schedule F2F appt prn.    Recent Visits Date Type Provider Dept  10/06/23 Office Visit Marcelino Nurse, MD [facility]  Showing recent visits within past 90 days and meeting all other requirements Today's Visits Date Type Provider Dept  12/17/23 Office Visit Marcelino Nurse, MD [facility]  Showing today's visits and meeting all other requirements Future Appointments No visits were found meeting these conditions. Showing future appointments within next 90 days and meeting all other requirements  I discussed the assessment and treatment plan with the patient. The patient was provided an opportunity to ask questions and all were answered. The patient agreed with the plan and demonstrated an understanding of the instructions.  Patient advised to call back or seek an in-person evaluation if the symptoms or condition worsens.  Duration of encounter: .  Total time on encounter, as per AMA guidelines included both the face-to-face and non-face-to-face time personally spent by the physician and/or other qualified health  care professional(s) on the day of the encounter (includes time in activities that require the physician or other qualified health care professional and does not include time in activities normally performed by clinical staff). Physician's time may include the following activities when performed: Preparing to see the patient (e.g., pre-charting review of records, searching for previously ordered imaging, lab work, and nerve conduction tests) Review of prior analgesic pharmacotherapies. Reviewing  PMP Interpreting ordered tests (e.g., lab work, imaging, nerve conduction tests) Performing post-procedure evaluations, including interpretation of diagnostic procedures Obtaining and/or reviewing separately obtained history Performing a medically appropriate examination and/or evaluation Counseling and educating the patient/family/caregiver Ordering medications, tests, or procedures Referring and communicating with other health care professionals (when not separately reported) Documenting clinical information in the electronic or other health record Independently interpreting results (not separately reported) and communicating results to the patient/ family/caregiver Care coordination (not separately reported)  Note by: Wallie Sherry, MD (TTS and AI technology used. I apologize for any typographical errors that were not detected and corrected.) Date: 12/17/2023; Time: 3:00 PM

## 2023-12-21 ENCOUNTER — Ambulatory Visit: Payer: Self-pay

## 2023-12-21 NOTE — Telephone Encounter (Signed)
-----   Message from Lauraine JAYSON Kanaris sent at 12/21/2023  9:26 AM EDT -----  1. Skin, left lateral arm :       SQUAMOUS CELL CARCINOMA, KERATOACANTHOMA TYPE   Please notify patient with below plan: - can offer ED&C with intralesional 5FU or excision  - please let me know what patient decides  ----- Message ----- From: Interface, Lab In [facility] Sent: 12/14/2023   5:14 PM EDT To: Lauraine JAYSON Kanaris, MD

## 2023-12-21 NOTE — Telephone Encounter (Signed)
 Discussed pathology results with she prefers excision here in the office since specimen will be sent back to pathologist to ensure all SCC has been removed.

## 2024-01-06 ENCOUNTER — Telehealth: Payer: Self-pay

## 2024-01-06 NOTE — Telephone Encounter (Signed)
Patient advised. aw

## 2024-01-06 NOTE — Telephone Encounter (Signed)
 Patient is scheduled for an excision with Dr. Raymund next Tuesday.   She is scheduled to get her flu vaccine today. Patient questioning if there is any reason she should not do flu vaccine today with surgery next week?

## 2024-01-12 ENCOUNTER — Ambulatory Visit (INDEPENDENT_AMBULATORY_CARE_PROVIDER_SITE_OTHER)

## 2024-01-12 ENCOUNTER — Telehealth: Payer: Self-pay

## 2024-01-12 DIAGNOSIS — C44629 Squamous cell carcinoma of skin of left upper limb, including shoulder: Secondary | ICD-10-CM

## 2024-01-12 DIAGNOSIS — C4492 Squamous cell carcinoma of skin, unspecified: Secondary | ICD-10-CM

## 2024-01-12 NOTE — Patient Instructions (Signed)

## 2024-01-12 NOTE — Progress Notes (Signed)
   Follow-Up Visit   Subjective  Angela Suarez is a 73 y.o. 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   The following portions of the chart were reviewed this encounter and updated as appropriate: medications, allergies, medical history  Review of Systems:  No other skin or systemic complaints except as noted in HPI or Assessment and Plan.  Objective  Well appearing patient in no apparent distress; mood and affect are within normal limits.   A focused examination was performed of the following areas: Left Forearm  Relevant exam findings are noted in the Assessment and Plan.  L lat arm Keratotic pink papule/nodule or plaque.   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 Skin excision  Excision method:  elliptical Lesion length (cm):  0.9 Lesion width (cm):  0.9 Margin per side (cm):  0.4 Total excision diameter (cm):  1.7 Informed consent: discussed and consent obtained   Timeout: patient name, date of birth, surgical site, and procedure verified   Procedure prep:  Patient was prepped and draped in usual sterile fashion Prep type:  Chlorhexidine  Anesthesia: the lesion was anesthetized in a standard fashion   Anesthetic:  1% lidocaine  w/ epinephrine  1-100,000 buffered w/ 8.4% NaHCO3 Instrument used: #15 blade   Hemostasis achieved with: suture, pressure and electrodesiccation   Outcome: patient tolerated procedure well with no complications    Skin repair Complexity:  Intermediate Final length (cm):  4.2 Informed consent: discussed and consent obtained   Timeout: patient name, date of birth, surgical site, and procedure verified   Procedure prep:  Patient was prepped and draped in usual sterile fashion Prep type:  Chlorhexidine  Anesthesia: the lesion was anesthetized in a standard fashion   Anesthetic:  1% lidocaine  w/ epinephrine  1-100,000 buffered w/ 8.4% NaHCO3 (12 cc total) Reason for  type of repair: reduce tension to allow closure, reduce the risk of dehiscence, infection, and necrosis, reduce subcutaneous dead space and avoid a hematoma, allow closure of the large defect and preserve normal anatomy   Undermining: edges could be approximated without difficulty   Subcutaneous layers (deep stitches):  Suture size:  4-0 Suture type: Monocryl (poliglecaprone 25)   Stitches:  Buried vertical mattress Fine/surface layer approximation (top stitches):  Suture size:  5-0 Suture type: Prolene (polypropylene)   Stitches: simple running   Hemostasis achieved with: suture, pressure and electrodesiccation Outcome: patient tolerated procedure well with no complications   Post-procedure details: sterile dressing applied and wound care instructions given   Dressing type: petrolatum, bandage and pressure dressing    Specimen 1 - Surgical pathology Differential Diagnosis: Biopsy proven SCC Accession #: [id number] Sup tag Check Margins: Yes  Return for suture removal in 7-10 days.  Documentation: I have reviewed the above documentation for accuracy and completeness, and I agree with the above.  Lauraine JAYSON Kanaris, MD

## 2024-01-12 NOTE — Telephone Encounter (Signed)
 Called patient to check in post procedure from this morning. Patient states she is doing well. Pressure bandage is still intact and patient denied pain at this time. Patient informed to call back with questions or concerns should they arise before her suture removal appointment.

## 2024-01-14 LAB — SURGICAL PATHOLOGY

## 2024-01-18 ENCOUNTER — Ambulatory Visit: Payer: Self-pay

## 2024-01-18 ENCOUNTER — Telehealth: Payer: Self-pay

## 2024-01-18 NOTE — Progress Notes (Signed)
 LMTRC

## 2024-01-18 NOTE — Telephone Encounter (Signed)
-----   Message from Lauraine JAYSON Kanaris sent at 01/18/2024  8:56 AM EDT -----    1. Skin (M), L lat arm :       RESIDUAL SQUAMOUS CELL CARCINOMA, MARGINS FREE   Please notify patient with below plan: - excision with clear margins, no further tx  ----- Message ----- From: Interface, Lab In Three Zero One Sent: 01/14/2024   5:56 PM EDT To: Lauraine JAYSON Kanaris, MD

## 2024-01-18 NOTE — Telephone Encounter (Signed)
 Discussed biopsy results with patient

## 2024-01-20 ENCOUNTER — Ambulatory Visit

## 2024-01-20 DIAGNOSIS — Z4802 Encounter for removal of sutures: Secondary | ICD-10-CM

## 2024-01-20 DIAGNOSIS — Z48817 Encounter for surgical aftercare following surgery on the skin and subcutaneous tissue: Secondary | ICD-10-CM

## 2024-01-20 NOTE — Progress Notes (Signed)
   Follow-Up Visit   Subjective  Angela Suarez is a 73 y.o. female who presents for the following: 1 wk s/p L lat arm, Bx proven SCC margins free   The following portions of the chart were reviewed this encounter and updated as appropriate: medications, allergies, medical history  Review of Systems:  No other skin or systemic complaints except as noted in HPI or Assessment and Plan.  Objective  Well appearing patient in no apparent distress; mood and affect are within normal limits.   A focused examination was performed of the following areas: L lat arm  Well healing excision   Relevant exam findings are noted in the Assessment and Plan.    Assessment & Plan   S/p SQUAMOUS CELL CARCINOMA excision, Margins free Bx proven L lat arm Exam: healing excision site  Treatment Plan: Encounter for Removal of Sutures - Incision site at the L lat arm is clean, dry and intact - Wound cleansed, sutures removed, wound cleansed and mupirocin ointment, telfa and coban.  - Discussed pathology results showing residual squamous cell carcinoma margins free. - Scars remodel for a full year. - Patient can apply over-the-counter silicone scar cream each night to help with scar remodeling if desired. - Patient advised to call with any concerns or if they notice any new or changing lesions.      Return in about 6 months (around 07/20/2024) for TBSE, Hx of SCC, Hx of Melanoma IS.  I, Sonya Hupman, RMA, am acting as scribe for Lauraine JAYSON Kanaris, MD .   Documentation: I have reviewed the above documentation for accuracy and completeness, and I agree with the above.  Lauraine JAYSON Kanaris, MD

## 2024-01-20 NOTE — Patient Instructions (Addendum)
 Continue Vasaline and bandage until no longer crusty.  In 6 weeks may start Silicone scar sheets Silicone scar reducing sheets or gel:   Use as directed - briefly, if you choose the sheets, cut to size and you can use for up to 1 week.   You can peel off before showering/exercise and then reapply after drying the area. The patch can be washed with mild soap and water  and reused until it begins to disintegrate  Each brand lasts a variable amount of time, usually at least a week per patch Replace every week and use for a total of about 8 weeks. Rarely people may develop an allergy to the adhesive used - if this happens, stop using the sheets and switch to the gel  If you use the gel, apply the gel 1-2x/day for 8 weeks total.   Brands to try: 1. Generic drugstore brands are just fine-- [facility], [facility], etc.  Just ensure silicone is the main ingredient. 2. Cica-Care Silicone Gel Sheeting:To purchase call [phone] or visit any one of several websites: GuyHumor.tn, VitalityMedical.com, Amazon.com 3. Novagel Silicone Gel Sheets: To purchase visit any of several websites: Amazon.com, AliMed.com, GuyHumor.tn 4. ScarAway: found at most major drug stores and [employer].com 5. Oleeva: fabric patches  Oleeva: [email or web address].norton-[email or web address]     Due to recent changes in healthcare laws, you may see results of your pathology and/or laboratory studies on MyChart before the doctors have had a chance to review them. We understand that in some cases there may be results that are confusing or concerning to you. Please understand that not all results are received at the same time and often the doctors may need to interpret multiple results in order to provide you with the best plan of care or course of treatment. Therefore, we ask that you please give us  2 business days to thoroughly review all your results before contacting the office for clarification. Should we see a critical lab  result, you will be contacted sooner.   If You Need Anything After Your Visit  If you have any questions or concerns for your doctor, please call our main line at [phone] and press option 4 to reach your doctor's medical assistant. If no one answers, please leave a voicemail as directed and we will return your call as soon as possible. Messages left after 4 pm will be answered the following business day.   You may also send us  a message via MyChart. We typically respond to MyChart messages within 1-2 business days.  For prescription refills, please ask your pharmacy to contact our office. Our fax number is [phone].  If you have an urgent issue when the clinic is closed that cannot wait until the next business day, you can page your doctor at the number below.    Please note that while we do our best to be available for urgent issues outside of office hours, we are not available 24/7.   If you have an urgent issue and are unable to reach us , you may choose to seek medical care at your doctor's office, retail clinic, urgent care center, or emergency room.  If you have a medical emergency, please immediately call 911 or go to the emergency department.  Pager Numbers  - Dr. Hester: [phone]  - Dr. Jackquline: [phone]  - Dr. Claudene: [phone]   - Dr. Raymund: [phone]  In the event of inclement weather, please call our main line at [phone] for an update on the status of any delays or  closures.  Dermatology Medication Tips: Please keep the boxes that topical medications come in in order to help keep track of the instructions about where and how to use these. Pharmacies typically print the medication instructions only on the boxes and not directly on the medication tubes.   If your medication is too expensive, please contact our office at [phone] option 4 or send us  a message through MyChart.   We are unable to tell what your co-pay for medications will be  in advance as this is different depending on your insurance coverage. However, we may be able to find a substitute medication at lower cost or fill out paperwork to get insurance to cover a needed medication.   If a prior authorization is required to get your medication covered by your insurance company, please allow us  1-2 business days to complete this process.  Drug prices often vary depending on where the prescription is filled and some pharmacies may offer cheaper prices.  The website www.goodrx.com contains coupons for medications through different pharmacies. The prices here do not account for what the cost may be with help from insurance (it may be cheaper with your insurance), but the website can give you the price if you did not use any insurance.  - You can print the associated coupon and take it with your prescription to the pharmacy.  - You may also stop by our office during regular business hours and pick up a GoodRx coupon card.  - If you need your prescription sent electronically to a different pharmacy, notify our office through [facility] or by phone at [phone] option 4.     Si Usted Necesita Algo Despus de Su Visita  Tambin puede enviarnos un mensaje a travs de Clinical cytogeneticist. Por lo general respondemos a los mensajes de MyChart en el transcurso de 1 a 2 das hbiles.  Para renovar recetas, por favor pida a su farmacia que se ponga en contacto con nuestra oficina. Randi lakes de fax es [place] [phone].  Si tiene un asunto urgente cuando la clnica est cerrada y que no puede esperar hasta el siguiente da hbil, puede llamar/localizar a su doctor(a) al nmero que aparece a continuacin.   Por favor, tenga en cuenta que aunque hacemos todo lo posible para estar disponibles para asuntos urgentes fuera del horario de [address], no estamos disponibles las 24 horas del da, los 7 [address]  [address] de la [place].   Si tiene un problema urgente y no puede comunicarse con nosotros,  puede optar por buscar atencin mdica  en el consultorio de su doctor(a), en una clnica privada, en un centro de atencin urgente o en una sala de emergencias.  Si tiene Engineer, drilling, por favor llame inmediatamente al 911 o vaya a la sala de emergencias.  Nmeros de bper  - Dr. Hester: [phone]  - Dra. Jackquline: [phone]  - Dr. Claudene: [phone]  - Dra. Kitts: [phone]  En caso de inclemencias del [place], por favor llame a nuestra lnea principal al [phone] para una actualizacin sobre el estado de cualquier retraso o cierre.  Consejos para la medicacin en dermatologa: Por favor, guarde las cajas en las que vienen los medicamentos de uso tpico para ayudarle a seguir las instrucciones sobre dnde y cmo usarlos. Las farmacias generalmente imprimen las instrucciones del medicamento slo en las cajas y no directamente en los tubos del [place].   Si su medicamento es muy caro, por favor, pngase en contacto con landry rieger llamando al [phone] y  presione la opcin 4 o envenos un mensaje a travs de MyChart.   No podemos decirle cul ser su copago por los medicamentos por adelantado ya que esto es diferente dependiendo de la cobertura de su seguro. Sin embargo, es posible que podamos encontrar un medicamento sustituto a Audiological scientist un formulario para que el seguro cubra el medicamento que se considera necesario.   Si se requiere una autorizacin previa para que su compaa de seguros malta su medicamento, por favor permtanos de 1 a 2 das hbiles para completar este proceso.  Los precios de los medicamentos varan con frecuencia dependiendo del Environmental consultant de dnde se surte la receta y alguna farmacias pueden ofrecer precios ms baratos.  El sitio web www.goodrx.com tiene cupones para medicamentos de Health and safety inspector. Los precios aqu no tienen en cuenta lo que podra costar con la ayuda del seguro (puede ser ms barato con su seguro),  pero el sitio web puede darle el precio si no utiliz Tourist information centre manager.  - Puede imprimir el cupn correspondiente y llevarlo con su receta a la farmacia.  - Tambin puede pasar por nuestra oficina durante el horario de atencin regular y Education officer, museum una tarjeta de cupones de GoodRx.  - Si necesita que su receta se enve electrnicamente a una farmacia diferente, informe a nuestra oficina a travs de MyChart de [place] o por telfono llamando al [phone] y presione la opcin 4.

## 2024-02-25 ENCOUNTER — Ambulatory Visit (INDEPENDENT_AMBULATORY_CARE_PROVIDER_SITE_OTHER)

## 2024-02-25 DIAGNOSIS — D692 Other nonthrombocytopenic purpura: Secondary | ICD-10-CM

## 2024-02-25 DIAGNOSIS — L821 Other seborrheic keratosis: Secondary | ICD-10-CM | POA: Diagnosis not present

## 2024-02-25 DIAGNOSIS — Z85828 Personal history of other malignant neoplasm of skin: Secondary | ICD-10-CM | POA: Diagnosis not present

## 2024-02-25 NOTE — Patient Instructions (Signed)

## 2024-02-25 NOTE — Progress Notes (Signed)
    Subjective   Angela Suarez is a 73 y.o. female who presents for the following: Lesion(s) of concern . Patient is established patient   Today patient reports: Irregular skin lesion on the R forearm x 1 mths.   Review of Systems:    No other skin or systemic complaints except as noted in HPI or Assessment and Plan.  The following portions of the chart were reviewed this encounter and updated as appropriate: medications, allergies, medical history  Relevant Medical History:  Personal history of non melanoma skin cancer - see medical history for full details  and Personal history of melanoma - see medical history for full details   Objective  (SKPE) Well appearing patient in no apparent distress; mood and affect are within normal limits. Examination was performed of the: Focused Exam of: the arms and hands   Examination notable for: Seborrheic Keratosis(es): Stuck-on appearing keratotic papule(s) on the trunk, some  irritated with redness, crusting, edema, and/or partial avulsion  Solar purpura  Examination limited by: n/a     Assessment & Plan  (SKAP)   Solar Purpura - Chronic; persistent and recurrent.  Treatable, but not curable. - Violaceous macules and patches - Benign - Related to trauma, age, sun damage and/or use of blood thinners, chronic use of topical and/or oral steroids - Observe - Can use OTC arnica containing moisturizer such as Dermend Bruise Formula if desired - Call for worsening or other concerns  SEBORRHEIC KERATOSIS - R forearm - Stuck-on, waxy, tan-brown papules and/or plaques  - Benign-appearing - Discussed benign etiology and prognosis. - Observe - Call for any changes  History of NMSC - scar on L arm healing well   Level of service outlined above   Patient instructions (SKPI)   Procedures, orders, diagnosis for this visit:    There are no diagnoses linked to this encounter.   Return to clinic: Return for appointment as scheduled.  Angela Suarez, CMA, am acting as scribe for Lauraine JAYSON Kanaris, MD .   Documentation: I have reviewed the above documentation for accuracy and completeness, and I agree with the above.  Lauraine JAYSON Kanaris, MD

## 2024-03-24 ENCOUNTER — Other Ambulatory Visit: Payer: Self-pay | Admitting: Internal Medicine

## 2024-03-24 DIAGNOSIS — Z1231 Encounter for screening mammogram for malignant neoplasm of breast: Secondary | ICD-10-CM

## 2024-04-14 ENCOUNTER — Ambulatory Visit

## 2024-04-14 DIAGNOSIS — L601 Onycholysis: Secondary | ICD-10-CM | POA: Diagnosis not present

## 2024-04-14 DIAGNOSIS — L603 Nail dystrophy: Secondary | ICD-10-CM

## 2024-04-14 DIAGNOSIS — A498 Other bacterial infections of unspecified site: Secondary | ICD-10-CM

## 2024-04-14 MED ORDER — GENTAMICIN SULFATE 0.1 % EX OINT: 1.0000 | TOPICAL_OINTMENT | Freq: Three times a day (TID) | CUTANEOUS | 2 refills | Status: AC

## 2024-04-14 MED ORDER — GENTAMICIN SULFATE 0.1 % EX OINT: 1.0000 | TOPICAL_OINTMENT | Freq: Three times a day (TID) | CUTANEOUS | 2 refills | Status: DC

## 2024-04-14 NOTE — Progress Notes (Signed)
" °  °  Subjective   Angela Suarez is a 73 y.o. female who presents for the following: nail problem. 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 Yesterday she noticed when removal of polish. Has thickness of nail and discoloration of nail. Patient currently has polish on and can slightly see discoloration. Patient states light sensitivity and no pain, no drainage. Hx of melanoma in situ  Review of Systems:    No other skin or systemic complaints except as noted in HPI or Assessment and Plan.  The following portions of the chart were reviewed this encounter and updated as appropriate: medications, allergies, medical history  Relevant Medical History:  n/a   Objective  (SKPE) Well appearing patient in no apparent distress; mood and affect are within normal limits. Examination was performed of the: Focused Exam of: left thumb   Examination notable for: L thumb with onycholysis, underlying green discoloration  Examination limited by: Undergarments and Clothing nail polish     Assessment & Plan  (SKAP)   Onycholysis w/ likely Pseudomonas aeruginosa At left thumb - Start vinegar soaks - instructions provided - Start gentamicin  0.1% ointment TID  - Advised patient to remove polish and return to office in 6 weeks  - Discussed limiting manicures, polish and advised will take time to grow out     Was sun protection counseling provided?: No   Level of service outlined above   Patient instructions (SKPI)   Procedures, orders, diagnosis for this visit:    There are no diagnoses linked to this encounter.  Return to clinic: Return in about 6 weeks (around 05/26/2024) for Nail f/u , w/ Dr. Raymund.  I, Angela Suarez, RMA, am acting as scribe for Angela JAYSON Raymund, MD .   Documentation: I have reviewed the above documentation for accuracy and completeness, and I agree with the above.  Angela JAYSON Raymund, MD  "

## 2024-04-14 NOTE — Patient Instructions (Addendum)
 Vinegar Soaks  INSTRUCTION FOR DILUTE WHITE VINEGAR SOAKS:    A white vinegar soak is made of 1 part white vinegar and 3 parts water .  Soak the affected area twice a day for 5 minutes.  After each soak, dry the areas well then apply the prescription medication (if we have recommended one).         Due to recent changes in healthcare laws, you may see results of your pathology and/or laboratory studies on MyChart before the doctors have had a chance to review them. We understand that in some cases there may be results that are confusing or concerning to you. Please understand that not all results are received at the same time and often the doctors may need to interpret multiple results in order to provide you with the best plan of care or course of treatment. Therefore, we ask that you please give us  2 business days to thoroughly review all your results before contacting the office for clarification. Should we see a critical lab result, you will be contacted sooner.   If You Need Anything After Your Visit  If you have any questions or concerns for your doctor, please call our main line at [phone] and press option 4 to reach your doctor's medical assistant. If no one answers, please leave a voicemail as directed and we will return your call as soon as possible. Messages left after 4 pm will be answered the following business day.   You may also send us  a message via MyChart. We typically respond to MyChart messages within 1-2 business days.  For prescription refills, please ask your pharmacy to contact our office. Our fax number is [phone].  If you have an urgent issue when the clinic is closed that cannot wait until the next business day, you can page your doctor at the number below.    Please note that while we do our best to be available for urgent issues outside of office hours, we are not available 24/7.   If you have an urgent issue and are unable to reach us , you may choose to seek  medical care at your doctor's office, retail clinic, urgent care center, or emergency room.  If you have a medical emergency, please immediately call 911 or go to the emergency department.  Pager Numbers  - Dr. Hester: [phone]  - Dr. Jackquline: [phone]  - Dr. Claudene: [phone]   - Dr. Raymund: [phone]  In the event of inclement weather, please call our main line at [phone] for an update on the status of any delays or closures.  Dermatology Medication Tips: Please keep the boxes that topical medications come in in order to help keep track of the instructions about where and how to use these. Pharmacies typically print the medication instructions only on the boxes and not directly on the medication tubes.   If your medication is too expensive, please contact our office at [phone] option 4 or send us  a message through MyChart.   We are unable to tell what your co-pay for medications will be in advance as this is different depending on your insurance coverage. However, we may be able to find a substitute medication at lower cost or fill out paperwork to get insurance to cover a needed medication.   If a prior authorization is required to get your medication covered by your insurance company, please allow us  1-2 business days to complete this process.  Drug prices often vary depending on where the prescription  is filled and some pharmacies may offer cheaper prices.  The website www.goodrx.com contains coupons for medications through different pharmacies. The prices here do not account for what the cost may be with help from insurance (it may be cheaper with your insurance), but the website can give you the price if you did not use any insurance.  - You can print the associated coupon and take it with your prescription to the pharmacy.  - You may also stop by our office during regular business hours and pick up a GoodRx coupon card.  - If you need your prescription  sent electronically to a different pharmacy, notify our office through [facility] or by phone at [phone] option 4.     Si Usted Necesita Algo Despus de Su Visita  Tambin puede enviarnos un mensaje a travs de Clinical Cytogeneticist. Por lo general respondemos a los mensajes de MyChart en el transcurso de 1 a 2 das hbiles.  Para renovar recetas, por favor pida a su farmacia que se ponga en contacto con nuestra oficina. Randi lakes de fax es [place] [phone].  Si tiene un asunto urgente cuando la clnica est cerrada y que no puede esperar hasta el siguiente da hbil, puede llamar/localizar a su doctor(a) al nmero que aparece a continuacin.   Por favor, tenga en cuenta que aunque hacemos todo lo posible para estar disponibles para asuntos urgentes fuera del horario de [place], no estamos disponibles las 24 horas del da, los 7 [address]  [address] de la [place].   Si tiene un problema urgente y no puede comunicarse con nosotros, puede optar por buscar atencin mdica  en el consultorio de su doctor(a), en una clnica privada, en un centro de atencin urgente o en una sala de emergencias.  Si tiene engineer, drilling, por favor llame inmediatamente al 911 o vaya a la sala de emergencias.  Nmeros de bper  - Dr. Hester: [phone]  - Dra. Jackquline: [phone]  - Dr. Claudene: [phone]  - Dra. Kitts: [phone]  En caso de inclemencias del [place], por favor llame a nuestra lnea principal al [phone] para una actualizacin sobre el estado de cualquier retraso o cierre.  Consejos para la medicacin en dermatologa: Por favor, guarde las cajas en las que vienen los medicamentos de uso tpico para ayudarle a seguir las instrucciones sobre dnde y cmo usarlos. Las farmacias generalmente imprimen las instrucciones del medicamento slo en las cajas y no directamente en los tubos del [place].   Si su medicamento es muy caro, por favor, pngase en contacto con landry rieger  llamando al [phone] y presione la opcin 4 o envenos un mensaje a travs de Clinical Cytogeneticist.   No podemos decirle cul ser su copago por los medicamentos por adelantado ya que esto es diferente dependiendo de la cobertura de su seguro. Sin embargo, es posible que podamos encontrar un medicamento sustituto a audiological scientist un formulario para que el seguro cubra el medicamento que se considera necesario.   Si se requiere una autorizacin previa para que su compaa de seguros cubra su medicamento, por favor permtanos de 1 a 2 das hbiles para completar este proceso.  Los precios de los medicamentos varan con frecuencia dependiendo del environmental consultant de dnde se surte la receta y alguna farmacias pueden ofrecer precios ms baratos.  El sitio web www.goodrx.com tiene cupones para medicamentos de health and safety inspector. Los precios aqu no tienen en cuenta lo que podra costar con la ayuda del seguro (puede ser ms barato con su seguro),  pero el sitio web puede darle el precio si no visual merchandiser.  - Puede imprimir el cupn correspondiente y llevarlo con su receta a la farmacia.  - Tambin puede pasar por nuestra oficina durante el horario de atencin regular y education officer, museum una tarjeta de cupones de GoodRx.  - Si necesita que su receta se enve electrnicamente a una farmacia diferente, informe a nuestra oficina a travs de MyChart de [place] o por telfono llamando al [phone] y presione la opcin 4.

## 2024-04-20 ENCOUNTER — Ambulatory Visit
Admission: RE | Admit: 2024-04-20 | Discharge: 2024-04-20 | Disposition: A | Discharge status: Home / Self Care | Source: Ambulatory Visit | Attending: Acute Care | Admitting: Acute Care

## 2024-04-20 DIAGNOSIS — F17210 Nicotine dependence, cigarettes, uncomplicated: Secondary | ICD-10-CM | POA: Diagnosis present

## 2024-04-20 DIAGNOSIS — Z87891 Personal history of nicotine dependence: Secondary | ICD-10-CM | POA: Insufficient documentation

## 2024-04-20 DIAGNOSIS — Z122 Encounter for screening for malignant neoplasm of respiratory organs: Secondary | ICD-10-CM | POA: Insufficient documentation

## 2024-04-26 ENCOUNTER — Ambulatory Visit
Admission: RE | Admit: 2024-04-26 | Discharge: 2024-04-26 | Disposition: A | Discharge status: Home / Self Care | Source: Ambulatory Visit | Attending: Internal Medicine | Admitting: Internal Medicine

## 2024-04-26 DIAGNOSIS — Z1231 Encounter for screening mammogram for malignant neoplasm of breast: Secondary | ICD-10-CM | POA: Insufficient documentation

## 2024-05-02 ENCOUNTER — Other Ambulatory Visit: Payer: Self-pay

## 2024-05-02 DIAGNOSIS — F17210 Nicotine dependence, cigarettes, uncomplicated: Secondary | ICD-10-CM

## 2024-05-02 DIAGNOSIS — Z87891 Personal history of nicotine dependence: Secondary | ICD-10-CM

## 2024-05-02 DIAGNOSIS — Z122 Encounter for screening for malignant neoplasm of respiratory organs: Secondary | ICD-10-CM

## 2024-05-26 ENCOUNTER — Ambulatory Visit

## 2024-07-20 ENCOUNTER — Ambulatory Visit

## 2024-09-27 ENCOUNTER — Encounter: Admitting: Dermatology
